# Patient Record
Sex: Female | Born: 1990 | Race: Black or African American | Hispanic: No | Smoking: Never smoker
Health system: Southern US, Community
[De-identification: ages and names within clinical notes are randomized; demographics above are authoritative.]

## PROBLEM LIST (undated history)

## (undated) DIAGNOSIS — J45909 Unspecified asthma, uncomplicated: Secondary | ICD-10-CM

## (undated) HISTORY — PX: NO PAST SURGERIES: SHX2092

---
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
## 2010-09-22 LAB — POCT URINALYSIS DIP (DEVICE)
Bilirubin Urine: NEGATIVE
Glucose, UA: NEGATIVE mg/dL
Protein, ur: 30 mg/dL — AB

## 2010-09-22 LAB — POCT PREGNANCY, URINE: Preg Test, Ur: NEGATIVE

## 2016-09-28 ENCOUNTER — Ambulatory Visit: Payer: 59 | Admitting: Obstetrics and Gynecology

## 2017-09-27 ENCOUNTER — Emergency Department (HOSPITAL_COMMUNITY)
Admission: EM | Admit: 2017-09-27 | Discharge: 2017-09-28 | Disposition: A | Discharge status: Home / Self Care | Payer: Self-pay | Attending: Emergency Medicine | Admitting: Emergency Medicine

## 2017-09-27 ENCOUNTER — Encounter (HOSPITAL_COMMUNITY): Payer: Self-pay

## 2017-09-27 ENCOUNTER — Other Ambulatory Visit: Payer: Self-pay

## 2017-09-27 DIAGNOSIS — N10 Acute tubulo-interstitial nephritis: Secondary | ICD-10-CM | POA: Insufficient documentation

## 2017-09-27 DIAGNOSIS — Z79899 Other long term (current) drug therapy: Secondary | ICD-10-CM | POA: Insufficient documentation

## 2017-09-27 DIAGNOSIS — N12 Tubulo-interstitial nephritis, not specified as acute or chronic: Secondary | ICD-10-CM

## 2017-09-27 DIAGNOSIS — J45909 Unspecified asthma, uncomplicated: Secondary | ICD-10-CM | POA: Insufficient documentation

## 2017-09-27 HISTORY — DX: Unspecified asthma, uncomplicated: J45.909

## 2017-09-27 LAB — URINALYSIS, ROUTINE W REFLEX MICROSCOPIC
Bacteria, UA: NONE SEEN
Bilirubin Urine: NEGATIVE
Glucose, UA: NEGATIVE mg/dL
Ketones, ur: 80 mg/dL — AB
Nitrite: NEGATIVE
PROTEIN: 100 mg/dL — AB
SPECIFIC GRAVITY, URINE: 1.016 (ref 1.005–1.030)
SQUAMOUS EPITHELIAL / LPF: NONE SEEN
pH: 5 (ref 5.0–8.0)

## 2017-09-27 LAB — CBC
HCT: 43.2 % (ref 36.0–46.0)
Hemoglobin: 14.6 g/dL (ref 12.0–15.0)
MCH: 31 pg (ref 26.0–34.0)
MCHC: 33.8 g/dL (ref 30.0–36.0)
MCV: 91.7 fL (ref 78.0–100.0)
PLATELETS: 293 10*3/uL (ref 150–400)
RBC: 4.71 MIL/uL (ref 3.87–5.11)
RDW: 13.4 % (ref 11.5–15.5)
WBC: 13.5 10*3/uL — AB (ref 4.0–10.5)

## 2017-09-27 LAB — COMPREHENSIVE METABOLIC PANEL
ALT: 9 U/L — AB (ref 14–54)
AST: 17 U/L (ref 15–41)
Albumin: 4.5 g/dL (ref 3.5–5.0)
Alkaline Phosphatase: 69 U/L (ref 38–126)
Anion gap: 17 — ABNORMAL HIGH (ref 5–15)
BUN: 14 mg/dL (ref 6–20)
CO2: 15 mmol/L — AB (ref 22–32)
CREATININE: 1.01 mg/dL — AB (ref 0.44–1.00)
Calcium: 9.7 mg/dL (ref 8.9–10.3)
Chloride: 101 mmol/L (ref 101–111)
Glucose, Bld: 85 mg/dL (ref 65–99)
Potassium: 4.1 mmol/L (ref 3.5–5.1)
Sodium: 133 mmol/L — ABNORMAL LOW (ref 135–145)
Total Bilirubin: 0.9 mg/dL (ref 0.3–1.2)
Total Protein: 8.7 g/dL — ABNORMAL HIGH (ref 6.5–8.1)

## 2017-09-27 LAB — LIPASE, BLOOD: Lipase: 23 U/L (ref 11–51)

## 2017-09-27 LAB — I-STAT BETA HCG BLOOD, ED (MC, WL, AP ONLY)

## 2017-09-27 NOTE — ED Triage Notes (Signed)
Pt reports RLQ abd pain x 2-3 days radiating to LLQ. Pt reports she has IUD in place and thinks pain may be due to that. Pt reports she has an IUD x 5 year and was due to take it out in November but has not done so. Pt reports minimal bleeding/spotting

## 2017-09-28 ENCOUNTER — Emergency Department (HOSPITAL_COMMUNITY): Payer: Self-pay

## 2017-09-28 MED ORDER — SODIUM CHLORIDE 0.9 % IV BOLUS
1000.0000 mL | Freq: Once | INTRAVENOUS | Status: AC
  Administered 2017-09-28: 1000 mL via INTRAVENOUS

## 2017-09-28 MED ORDER — IOPAMIDOL (ISOVUE-300) INJECTION 61%
INTRAVENOUS | Status: AC
  Filled 2017-09-28: qty 100

## 2017-09-28 MED ORDER — HYDROCODONE-ACETAMINOPHEN 5-325 MG PO TABS: 1.0000 | ORAL_TABLET | Freq: Four times a day (QID) | ORAL | 0 refills | Status: DC | PRN

## 2017-09-28 MED ORDER — CEPHALEXIN 500 MG PO CAPS: 500.0000 mg | ORAL_CAPSULE | Freq: Three times a day (TID) | ORAL | 0 refills | Status: DC

## 2017-09-28 MED ORDER — IOPAMIDOL (ISOVUE-300) INJECTION 61%
100.0000 mL | Freq: Once | INTRAVENOUS | Status: AC | PRN
  Administered 2017-09-28: 100 mL via INTRAVENOUS

## 2017-09-28 MED ORDER — SODIUM CHLORIDE 0.9 % IV SOLN
1.0000 g | Freq: Once | INTRAVENOUS | Status: AC
  Administered 2017-09-28: 1 g via INTRAVENOUS
  Filled 2017-09-28: qty 10

## 2017-09-28 NOTE — ED Provider Notes (Signed)
[facility] EMERGENCY DEPARTMENT Provider Note   CSN: [id number] Arrival date & time: 09/27/17  1817     History   Chief Complaint Chief Complaint  Patient presents with  . Abdominal Pain    HPI Amanda Rhodes is a 27 y.o. female.  HPI  This is a 27 year old female who presents with 3-day history of right lower quadrant pain.  Patient reports periumbilical and right lower quadrant pain that has been constant over the last 3 days.  She reports at times it is sharp.  Nonradiating.  She denies any back pain.  She denies any fevers but does report chills.  She states she has had decreased p.o. intake.  No vomiting or diarrhea.  She does report dysuria.  No hematuria.  Currently she rates her pain at 8 out of 10.  She has never had symptoms like this before.   Past Medical History:  Diagnosis Date  . Asthma     There are no active problems to display for this patient.   History reviewed. No pertinent surgical history.   OB History   None      Home Medications    Prior to Admission medications   Medication Sig Start Date End Date Taking? Authorizing Provider  diphenhydramine-acetaminophen (TYLENOL PM) 25-500 MG TABS tablet Take 2 tablets by mouth at bedtime as needed (pain).   Yes [provider]  cephALEXin (KEFLEX) 500 MG capsule Take 1 capsule (500 mg total) by mouth 3 (three) times daily. 09/28/17   Bane Hagy, Mayer Masker, MD  HYDROcodone-acetaminophen (NORCO/VICODIN) 5-325 MG tablet Take 1 tablet by mouth every 6 (six) hours as needed. 09/28/17   Tri Chittick, Mayer Masker, MD    Family History History reviewed. No pertinent family history.  Social History Social History   Tobacco Use  . Smoking status: Never Smoker  . Smokeless tobacco: Never Used  Substance Use Topics  . Alcohol use: Yes    Alcohol/week: 6.0 oz    Types: 10 Cans of beer per week  . Drug use: Not Currently     Allergies   Patient has no known allergies.   Review of  Systems Review of Systems  Constitutional: Positive for chills. Negative for fever.  Respiratory: Negative for shortness of breath.   Cardiovascular: Negative for chest pain.  Gastrointestinal: Positive for abdominal pain. Negative for nausea and vomiting.  Genitourinary: Positive for dysuria. Negative for hematuria.  Musculoskeletal: Negative for back pain.  Neurological: Negative for headaches.  All other systems reviewed and are negative.    Physical Exam Updated Vital Signs BP 122/83   Pulse 93   Temp 98.6 F (37 C) (Oral)   Resp 16   Ht 5\' 5"  (1.651 m)   Wt 72.6 kg (160 lb)   SpO2 100%   BMI 26.63 kg/m   Physical Exam  Constitutional: She is oriented to person, place, and time. She appears well-developed and well-nourished. She does not appear ill.  HENT:  Head: Normocephalic and atraumatic.  Neck: Neck supple.  Cardiovascular: Normal rate, regular rhythm and normal heart sounds.  Pulmonary/Chest: Effort normal. No respiratory distress. She has no wheezes.  Abdominal: Soft. Normal appearance and bowel sounds are normal. There is tenderness in the right lower quadrant. There is CVA tenderness.  Right lower quadrant tenderness to palpation, no rebound or guarding, negative Rovsing's  Neurological: She is alert and oriented to person, place, and time.  Skin: Skin is warm and dry.  Psychiatric: She has a normal mood  and affect.  Nursing note and vitals reviewed.    ED Treatments / Results  Labs (all labs ordered are listed, but only abnormal results are displayed) Labs Reviewed  COMPREHENSIVE METABOLIC PANEL - Abnormal; Notable for the following components:      Result Value   Sodium 133 (*)    CO2 15 (*)    Creatinine, Ser 1.01 (*)    Total Protein 8.7 (*)    ALT 9 (*)    Anion gap 17 (*)    All other components within normal limits  CBC - Abnormal; Notable for the following components:   WBC 13.5 (*)    All other components within normal limits    URINALYSIS, ROUTINE W REFLEX MICROSCOPIC - Abnormal; Notable for the following components:   APPearance CLOUDY (*)    Hgb urine dipstick LARGE (*)    Ketones, ur 80 (*)    Protein, ur 100 (*)    Leukocytes, UA LARGE (*)    Non Squamous Epithelial 0-5 (*)    All other components within normal limits  URINE CULTURE  LIPASE, BLOOD  I-STAT BETA HCG BLOOD, ED ([facility], [facility], [facility] ONLY)    EKG None  Radiology Ct Abdomen Pelvis W Contrast  Result Date: 09/28/2017 CLINICAL DATA:  Acute abdominal pain.  Right-sided pain. EXAM: CT ABDOMEN AND PELVIS WITH CONTRAST TECHNIQUE: Multidetector CT imaging of the abdomen and pelvis was performed using the standard protocol following bolus administration of intravenous contrast. CONTRAST:  ISOVUE-300 IOPAMIDOL (ISOVUE-300) INJECTION 61% COMPARISON:  None. FINDINGS: Lower chest: The lung bases are clear. Hepatobiliary: No focal liver abnormality is seen. No gallstones, gallbladder wall thickening, or biliary dilatation. Pancreas: No ductal dilatation or inflammation. Spleen: Normal in size without focal abnormality. Adrenals/Urinary Tract: Normal adrenal glands. Mild right hydronephrosis with perinephric edema. Heterogeneous right renal enhancement. Peri tear extending about the proximal ureter. No urolithiasis or cause of obstruction. Urinary bladder is nondistended limiting assessment, probable bladder wall thickening and perivesicular stranding. No perirenal or intrarenal abscess or fluid collection. Homogeneous left renal enhancement without left hydronephrosis. Stomach/Bowel: Stomach is within normal limits. Appendix is tentatively identified and appears normal. Regardless, no pericecal or right lower quadrant inflammation. No evidence of bowel wall thickening, distention, or inflammatory changes. Vascular/Lymphatic: No significant vascular findings are present. No enlarged abdominal or pelvic lymph nodes. Reproductive: Retroverted uterus with IUD in place.  Peripherally enhancing corpus luteal cyst in the left ovary with adjacent prominent follicle. Right ovary is normal in size. Minimal free fluid in the pelvis likely physiologic. Other: No upper abdominal ascites.  No free air. Musculoskeletal: There are no acute or suspicious osseous abnormalities. IMPRESSION: Findings consistent with right pyelonephritis and probable cystitis. No renal abscess. Electronically Signed   By: Rubye Oaks M.D.   On: 09/28/2017 04:27    Procedures Procedures (including critical care time)  Medications Ordered in ED Medications  iopamidol (ISOVUE-300) 61 % injection (has no administration in time range)  sodium chloride 0.9 % bolus 1,000 mL (1,000 mLs Intravenous New Bag/Given 09/28/17 0352)  cefTRIAXone (ROCEPHIN) 1 g in sodium chloride 0.9 % 100 mL IVPB (1 g Intravenous New Bag/Given 09/28/17 0352)  iopamidol (ISOVUE-300) 61 % injection 100 mL (100 mLs Intravenous Contrast Given 09/28/17 0405)     Initial Impression / Assessment and Plan / ED Course  I have reviewed the triage vital signs and the nursing notes.  Pertinent labs & imaging results that were available during my care of the patient were reviewed by  me and considered in my medical decision making (see chart for details).     Patient presents with 3-day history of right lower quadrant pain.  She is overall nontoxic appearing.  Vital signs notable for tachycardia.  Temperature 99.6.  She reports dysuria.  Urinalysis does have too numerous to count red cells and white cells.  Urine culture was sent.  Patient has a leukocytosis.  Feel this is likely pyelonephritis; however, cannot rule out appendicitis which would also have a similar presentation and can have pyuria.  Given that most of her pain is in the right lower quadrant and not her back, will obtain a CT scan to evaluate for appendicitis.  This will also evaluate for possible kidney stones.  CT scan shows only evidence of pyelonephritis.  Patient was  given IV Rocephin.  She is otherwise nontoxic appearing.  Vital signs improved with fluids.  She feels much better.  Will discharge home with antibiotics and pain medication.  After history, exam, and medical workup I feel the patient has been appropriately medically screened and is safe for discharge home. Pertinent diagnoses were discussed with the patient. Patient was given return precautions.   Final Clinical Impressions(s) / ED Diagnoses   Final diagnoses:  Pyelonephritis    ED Discharge Orders        Ordered    cephALEXin (KEFLEX) 500 MG capsule  3 times daily     09/28/17 0454    HYDROcodone-acetaminophen (NORCO/VICODIN) 5-325 MG tablet  Every 6 hours PRN     09/28/17 0454       Shon BatonHorton, Cozetta Seif F, MD 09/28/17 [phone]

## 2017-09-28 NOTE — Discharge Instructions (Addendum)
You were seen today for right-sided abdominal pain.  You have a kidney infection.  Take antibiotics as prescribed.  If you develop any new or worsening symptoms or fevers you need to be reevaluated.

## 2017-09-30 LAB — URINE CULTURE: Culture: >=100000 — AB

## 2017-10-01 ENCOUNTER — Telehealth: Payer: Self-pay

## 2017-10-01 NOTE — Telephone Encounter (Signed)
Post ED Visit - Positive Culture Follow-up  Culture report reviewed by antimicrobial stewardship pharmacist:  []  Enzo BiNathan Batchelder, Pharm.D. []  Celedonio MiyamotoJeremy Frens, [address] BCPS AQ-ID []  Garvin FilaMike Maccia, Pharm.D., BCPS []  Georgina PillionElizabeth Martin, [address] BCPS []  VincoMinh Pham, [address]Ph[address]., BCPS, AAHIVP []  Estella HuskMichelle Turner, Pharm.D., BCPS, AAHIVP []  Lysle Pearlachel Rumbarger, PharmD, BCPS []  Blake DivineShannon Parkey, PharmD []  Pollyann SamplesAndy Johnston, PharmD, BCPS Sharin MonsEmily Sinclair Pharm D Positive Urine culture Treated with Cephalexin, organism sensitive to the same and no further patient follow-up is required at this time.  Jerry CarasCullom, Shavon Ashmore Burnett 10/01/2017, 10:37 AM

## 2018-01-10 ENCOUNTER — Encounter (HOSPITAL_COMMUNITY): Payer: Self-pay | Admitting: Emergency Medicine

## 2018-01-10 ENCOUNTER — Ambulatory Visit (HOSPITAL_COMMUNITY)
Admission: EM | Admit: 2018-01-10 | Discharge: 2018-01-10 | Disposition: A | Discharge status: Home / Self Care | Payer: Self-pay | Attending: Family Medicine | Admitting: Family Medicine

## 2018-01-10 DIAGNOSIS — J45909 Unspecified asthma, uncomplicated: Secondary | ICD-10-CM | POA: Insufficient documentation

## 2018-01-10 DIAGNOSIS — R300 Dysuria: Secondary | ICD-10-CM | POA: Insufficient documentation

## 2018-01-10 DIAGNOSIS — R1032 Left lower quadrant pain: Secondary | ICD-10-CM | POA: Insufficient documentation

## 2018-01-10 DIAGNOSIS — Z3202 Encounter for pregnancy test, result negative: Secondary | ICD-10-CM

## 2018-01-10 DIAGNOSIS — R509 Fever, unspecified: Secondary | ICD-10-CM | POA: Insufficient documentation

## 2018-01-10 DIAGNOSIS — N12 Tubulo-interstitial nephritis, not specified as acute or chronic: Secondary | ICD-10-CM | POA: Insufficient documentation

## 2018-01-10 DIAGNOSIS — R000 Tachycardia, unspecified: Secondary | ICD-10-CM | POA: Insufficient documentation

## 2018-01-10 DIAGNOSIS — R112 Nausea with vomiting, unspecified: Secondary | ICD-10-CM | POA: Insufficient documentation

## 2018-01-10 DIAGNOSIS — R319 Hematuria, unspecified: Secondary | ICD-10-CM | POA: Insufficient documentation

## 2018-01-10 LAB — POCT URINALYSIS DIP (DEVICE)
GLUCOSE, UA: NEGATIVE mg/dL
KETONES UR: 40 mg/dL — AB
Nitrite: NEGATIVE
Protein, ur: 100 mg/dL — AB
SPECIFIC GRAVITY, URINE: 1.02 (ref 1.005–1.030)
UROBILINOGEN UA: 0.2 mg/dL (ref 0.0–1.0)
pH: 5.5 (ref 5.0–8.0)

## 2018-01-10 LAB — POCT PREGNANCY, URINE: Preg Test, Ur: NEGATIVE

## 2018-01-10 MED ORDER — CEFTRIAXONE SODIUM 1 G IJ SOLR
INTRAMUSCULAR | Status: AC
  Filled 2018-01-10: qty 10

## 2018-01-10 MED ORDER — ONDANSETRON HCL 4 MG PO TABS: 4.0000 mg | ORAL_TABLET | Freq: Three times a day (TID) | ORAL | 0 refills | Status: DC | PRN

## 2018-01-10 MED ORDER — ONDANSETRON 4 MG PO TBDP
4.0000 mg | ORAL_TABLET | Freq: Once | ORAL | Status: AC
  Administered 2018-01-10: 4 mg via ORAL

## 2018-01-10 MED ORDER — IBUPROFEN 800 MG PO TABS: 800.0000 mg | ORAL_TABLET | Freq: Three times a day (TID) | ORAL | 0 refills | Status: DC

## 2018-01-10 MED ORDER — CEFTRIAXONE SODIUM 1 G IJ SOLR
1.0000 g | Freq: Once | INTRAMUSCULAR | Status: AC
  Administered 2018-01-10: 1 g via INTRAMUSCULAR

## 2018-01-10 MED ORDER — LIDOCAINE HCL (PF) 2 % IJ SOLN
INTRAMUSCULAR | Status: AC
  Filled 2018-01-10: qty 2

## 2018-01-10 MED ORDER — ONDANSETRON 4 MG PO TBDP
ORAL_TABLET | ORAL | Status: AC
  Filled 2018-01-10: qty 1

## 2018-01-10 MED ORDER — CEPHALEXIN 500 MG PO CAPS: 500.0000 mg | ORAL_CAPSULE | Freq: Three times a day (TID) | ORAL | 0 refills | Status: DC

## 2018-01-10 NOTE — ED Triage Notes (Signed)
Pt here for UTI sx and not feeling better after meds

## 2018-01-10 NOTE — ED Notes (Signed)
Patient given PO fluid challenge per provider.

## 2018-01-10 NOTE — Discharge Instructions (Signed)
It was nice meeting you!!  I am treating you for a kidney infection. It is really important that you stay hydrated.  You were able to tolerate the Gatorade here so I think its safe to go home.  I am giving you some more nausea medication and antibiotics. Please finish the full course of antibiotics.  As we discussed if you develop worsening symptom, more severe pain, fever and unable to hold down fluids please go to the ER.

## 2018-01-10 NOTE — ED Provider Notes (Signed)
[facility]    CSN: [id number] Arrival date & time: 01/10/18  1307     History   Chief Complaint Chief Complaint  Patient presents with  . Urinary Tract Infection    appt 1300    HPI [name] is a 27 y.o. female.   Patient is a healthy 27 year old female that comes in with 1 week of symptoms.  She has been having left flank pain, left lower abdominal pain, nausea, vomiting, fever, chills.  She has been unable to really eat or drink anything.  She has been having some dysuria, hematuria.  Taking over-the-counter urinary symptom relief.  She has not taken any fever reducer.  She was treated for pyelonephritis back in April.  She did improve after treatment with that.  Reports this instance is worse.  She denies any constipation, diarrhea, vaginal bleeding or discharge.  She denies any injuries to the back or heavy lifting.   She also admits to having her IUD in for the past 6-1/2 years and she is unsure of her last menstrual period.   ROS per HPI      Past Medical History:  Diagnosis Date  . Asthma     There are no active problems to display for this patient.   History reviewed. No pertinent surgical history.  OB History   None      Home Medications    Prior to Admission medications   Medication Sig Start Date End Date Taking? Authorizing Provider  cephALEXin (KEFLEX) 500 MG capsule Take 1 capsule (500 mg total) by mouth 3 (three) times daily. 09/28/17   Horton, Mayer Maskerourtney F, MD  diphenhydramine-acetaminophen (TYLENOL PM) 25-500 MG TABS tablet Take 2 tablets by mouth at bedtime as needed (pain).    [provider]  HYDROcodone-acetaminophen (NORCO/VICODIN) 5-325 MG tablet Take 1 tablet by mouth every 6 (six) hours as needed. Patient not taking: Reported on 01/10/2018 09/28/17   Horton, Mayer Maskerourtney F, MD    Family History History reviewed. No pertinent family history.  Social History Social History   Tobacco Use  . Smoking status: Never  Smoker  . Smokeless tobacco: Never Used  Substance Use Topics  . Alcohol use: Yes    Alcohol/week: 6.0 oz    Types: 10 Cans of beer per week  . Drug use: Not Currently     Allergies   Patient has no known allergies.   Review of Systems Review of Systems   Physical Exam Triage Vital Signs ED Triage Vitals [01/10/18 1328]  Enc Vitals Group     BP 110/82     Pulse Rate (!) 124     Resp 18     Temp 98.4 F (36.9 C)     Temp Source Oral     SpO2 100 %     Weight      Height      Head Circumference      Peak Flow      Pain Score      Pain Loc      Pain Edu?      Excl. in GC?    No data found.  Updated Vital Signs BP 110/82 (BP Location: Left Arm)   Pulse (!) 124   Temp 98.4 F (36.9 C) (Oral)   Resp 18   SpO2 100%   Visual Acuity Right Eye Distance:   Left Eye Distance:   Bilateral Distance:    Right Eye Near:   Left Eye Near:    Bilateral  Near:     Physical Exam  Constitutional: She is oriented to person, place, and time. She appears well-developed and well-nourished.  HENT:  Head: Normocephalic and atraumatic.  Cardiovascular: Regular rhythm.  Pulmonary/Chest: Effort normal and breath sounds normal.  Abdominal:  Positive left CVA tenderness.  Tender to palpation of left flank and left lower quadrant.   Lymphadenopathy:    She has no cervical adenopathy.  Neurological: She is alert and oriented to person, place, and time.  Skin: Skin is warm and dry. Capillary refill takes 2 to 3 seconds.  Psychiatric: She has a normal mood and affect.  Nursing note and vitals reviewed.    UC Treatments / Results  Labs (all labs ordered are listed, but only abnormal results are displayed) Labs Reviewed  POCT URINALYSIS DIP (DEVICE) - Abnormal; Notable for the following components:      Result Value   Bilirubin Urine SMALL (*)    Ketones, ur 40 (*)    Hgb urine dipstick MODERATE (*)    Protein, ur 100 (*)    Leukocytes, UA SMALL (*)    All other  components within normal limits  URINE CULTURE  POCT PREGNANCY, URINE    EKG None  Radiology No results found.  Procedures Procedures (including critical care time)  Medications Ordered in UC Medications  cefTRIAXone (ROCEPHIN) injection 1 g (has no administration in time range)  ondansetron (ZOFRAN-ODT) disintegrating tablet 4 mg (has no administration in time range)    Initial Impression / Assessment and Plan / UC Course  I have reviewed the triage vital signs and the nursing notes.  Pertinent labs & imaging results that were available during my care of the patient were reviewed by me and considered in my medical decision making (see chart for details).     Based on patient urine results, sickle exam and tachycardia will treat for pyelonephritis with 1 g Rocephin in clinic.  Orthostatics slightly positive will give Zofran for nausea and try to rehydrate orally.  If patient unable to tolerate oral fluids will place IV for rehydration.    Patient feeling slightly better and able to tolerate p.o. Liquids.  Will discharge home with prescription for Keflex and Zofran for nausea vomiting.   Patient made aware that if she develops more severe pain, vomiting, fever and is unable to hold any liquids she needs to go to the ER.  Patient agreeable to plan Final Clinical Impressions(s) / UC Diagnoses   Final diagnoses:  None   Discharge Instructions   None    ED Prescriptions    None     Controlled Substance Prescriptions [place] Controlled Substance Registry consulted? Not Applicable   Janace Aris, NP 01/10/18 [phone]

## 2018-01-12 LAB — URINE CULTURE

## 2019-07-24 ENCOUNTER — Ambulatory Visit (INDEPENDENT_AMBULATORY_CARE_PROVIDER_SITE_OTHER): Payer: PRIVATE HEALTH INSURANCE | Admitting: *Deleted

## 2019-07-24 ENCOUNTER — Other Ambulatory Visit: Payer: Self-pay

## 2019-07-24 ENCOUNTER — Encounter: Payer: Self-pay | Admitting: General Practice

## 2019-07-24 VITALS — BP 117/78 | HR 100 | Temp 98.0°F | Ht 65.0 in | Wt 191.0 lb

## 2019-07-24 DIAGNOSIS — Z3200 Encounter for pregnancy test, result unknown: Secondary | ICD-10-CM

## 2019-07-24 DIAGNOSIS — Z3201 Encounter for pregnancy test, result positive: Secondary | ICD-10-CM

## 2019-07-24 LAB — POCT URINE PREGNANCY: Preg Test, Ur: POSITIVE — AB

## 2019-07-24 NOTE — Progress Notes (Signed)
   Ms. Fesperman presents today for UPT. She has no unusual complaints. LMP: 06/13/2019    OBJECTIVE: Appears well, in no apparent distress.  OB History    Gravida  1   Para      Term      Preterm      AB      Living        SAB      TAB      Ectopic      Multiple      Live Births             Home UPT Result: Positive In-Office UPT result: Positive I have reviewed the patient's medical, obstetrical, social, and family histories, and medications.   ASSESSMENT: Positive pregnancy test  EDD: 03/11/2020 by LMP GA: [redacted]w[redacted]d  PLAN Prenatal care to be completed at: [facility]; Patient live closer to the office. Number given to call for an appointment.  Clovis Pu, RN

## 2019-07-27 ENCOUNTER — Other Ambulatory Visit: Payer: Self-pay

## 2019-07-27 ENCOUNTER — Inpatient Hospital Stay (HOSPITAL_COMMUNITY)
Admission: AD | Admit: 2019-07-27 | Discharge: 2019-07-27 | Disposition: A | Discharge status: Home / Self Care | Payer: PRIVATE HEALTH INSURANCE | Attending: Obstetrics & Gynecology | Admitting: Obstetrics & Gynecology

## 2019-07-27 ENCOUNTER — Inpatient Hospital Stay (HOSPITAL_COMMUNITY): Payer: PRIVATE HEALTH INSURANCE

## 2019-07-27 ENCOUNTER — Encounter (HOSPITAL_COMMUNITY): Payer: Self-pay | Admitting: Diagnostic Radiology

## 2019-07-27 DIAGNOSIS — O26891 Other specified pregnancy related conditions, first trimester: Secondary | ICD-10-CM | POA: Diagnosis not present

## 2019-07-27 DIAGNOSIS — O3680X0 Pregnancy with inconclusive fetal viability, not applicable or unspecified: Secondary | ICD-10-CM | POA: Diagnosis not present

## 2019-07-27 DIAGNOSIS — O209 Hemorrhage in early pregnancy, unspecified: Secondary | ICD-10-CM

## 2019-07-27 DIAGNOSIS — Z3A01 Less than 8 weeks gestation of pregnancy: Secondary | ICD-10-CM | POA: Diagnosis not present

## 2019-07-27 DIAGNOSIS — R102 Pelvic and perineal pain: Secondary | ICD-10-CM | POA: Diagnosis present

## 2019-07-27 LAB — CBC
HCT: 39.8 % (ref 36.0–46.0)
Hemoglobin: 13.3 g/dL (ref 12.0–15.0)
MCH: 30.8 pg (ref 26.0–34.0)
MCHC: 33.4 g/dL (ref 30.0–36.0)
MCV: 92.1 fL (ref 80.0–100.0)
Platelets: 366 10*3/uL (ref 150–400)
RBC: 4.32 MIL/uL (ref 3.87–5.11)
RDW: 11.7 % (ref 11.5–15.5)
WBC: 10.7 10*3/uL — ABNORMAL HIGH (ref 4.0–10.5)
nRBC: 0 % (ref 0.0–0.2)

## 2019-07-27 LAB — URINALYSIS, ROUTINE W REFLEX MICROSCOPIC
Bilirubin Urine: NEGATIVE
Glucose, UA: NEGATIVE mg/dL
Ketones, ur: 80 mg/dL — AB
Leukocytes,Ua: NEGATIVE
Nitrite: NEGATIVE
Protein, ur: 30 mg/dL — AB
Specific Gravity, Urine: 1.026 (ref 1.005–1.030)
pH: 5 (ref 5.0–8.0)

## 2019-07-27 LAB — WET PREP, GENITAL
Clue Cells Wet Prep HPF POC: NONE SEEN
Sperm: NONE SEEN
Trich, Wet Prep: NONE SEEN
Yeast Wet Prep HPF POC: NONE SEEN

## 2019-07-27 LAB — HCG, QUANTITATIVE, PREGNANCY: hCG, Beta Chain, Quant, S: 224 m[IU]/mL — ABNORMAL HIGH (ref <5)

## 2019-07-27 LAB — HIV ANTIBODY (ROUTINE TESTING W REFLEX): HIV Screen 4th Generation wRfx: NONREACTIVE

## 2019-07-27 NOTE — MAU Provider Note (Signed)
History     CSN: [id number]  Arrival date and time: 07/27/19 1644   First Provider Initiated Contact with Patient 07/27/19 1812      Chief Complaint  Patient presents with  . Pelvic Pain  . Vaginal Bleeding   Amanda Rhodes is 29 y.o. G1P0 at [redacted]w[redacted]d who presents today with cramping and bleeding. NOB intake on 08/13/19 at [place].   Pelvic Pain The patient's primary symptoms include pelvic pain and vaginal bleeding. The patient's pertinent negatives include no vaginal discharge. This is a new problem. The current episode started in the past 7 days. The problem occurs constantly. The problem has been gradually worsening. The problem affects both sides. She is pregnant. Associated symptoms include nausea. Pertinent negatives include no chills, dysuria, fever or vomiting. The vaginal discharge was bloody. The vaginal bleeding is lighter than menses. Nothing aggravates the symptoms. She has tried nothing for the symptoms. She uses nothing for contraception. Her menstrual history has been regular (LMP 06/13/2019).  Vaginal Bleeding The patient's primary symptoms include pelvic pain and vaginal bleeding. The patient's pertinent negatives include no vaginal discharge. Associated symptoms include nausea. Pertinent negatives include no chills, dysuria, fever or vomiting.    OB History    Gravida  1   Para      Term      Preterm      AB      Living        SAB      TAB      Ectopic      Multiple      Live Births              Past Medical History:  Diagnosis Date  . Asthma     Past Surgical History:  Procedure Laterality Date  . NO PAST SURGERIES      Family History  Problem Relation Age of Onset  . Healthy Mother   . Healthy Father     Social History   Tobacco Use  . Smoking status: Never Smoker  . Smokeless tobacco: Never Used  Substance Use Topics  . Alcohol use: Yes    Alcohol/week: 10.0 standard drinks    Types: 10 Cans of beer per week   Comment: Socially  . Drug use: Not Currently    Types: Marijuana    Comment: Last smoked early January 2021    Allergies: No Known Allergies  Medications Prior to Admission  Medication Sig Dispense Refill Last Dose  . Prenatal Vit-Fe Fumarate-FA (MULTIVITAMIN-PRENATAL) 27-0.8 MG TABS tablet Take 1 tablet by mouth daily at 12 noon.   07/26/2019 at 1000  . cephALEXin (KEFLEX) 500 MG capsule Take 1 capsule (500 mg total) by mouth 3 (three) times daily. 21 capsule 0   . diphenhydramine-acetaminophen (TYLENOL PM) 25-500 MG TABS tablet Take 2 tablets by mouth at bedtime as needed (pain).     Marland Kitchen HYDROcodone-acetaminophen (NORCO/VICODIN) 5-325 MG tablet Take 1 tablet by mouth every 6 (six) hours as needed. (Patient not taking: Reported on 01/10/2018) 10 tablet 0   . ibuprofen (ADVIL,MOTRIN) 800 MG tablet Take 1 tablet (800 mg total) by mouth 3 (three) times daily. 21 tablet 0   . ondansetron (ZOFRAN) 4 MG tablet Take 1 tablet (4 mg total) by mouth every 8 (eight) hours as needed for nausea or vomiting. 4 tablet 0     Review of Systems  Constitutional: Negative for chills and fever.  Gastrointestinal: Positive for nausea. Negative for vomiting.  Genitourinary: Positive for pelvic  pain and vaginal bleeding. Negative for dysuria and vaginal discharge.   Physical Exam   Blood pressure 121/74, pulse 99, temperature 99 F (37.2 C), temperature source Oral, resp. rate 17, height 5\' 5"  (1.651 m), weight 85.1 kg, last menstrual period 06/13/2019, SpO2 100 %.  Physical Exam  Nursing note and vitals reviewed. Constitutional: She is oriented to person, place, and time. She appears well-developed and well-nourished. No distress.  HENT:  Head: Normocephalic.  Cardiovascular: Normal rate.  Respiratory: Effort normal.  GI: Soft. There is no abdominal tenderness. There is no rebound.  Neurological: She is alert and oriented to person, place, and time.  Skin: Skin is warm and dry.  Psychiatric: She has a  normal mood and affect.    Results for orders placed or performed during the hospital encounter of 07/27/19 (from the past 24 hour(s))  Urinalysis, Routine w reflex microscopic     Status: Abnormal   Collection Time: 07/27/19  5:44 PM  Result Value Ref Range   Color, Urine YELLOW YELLOW   APPearance CLEAR CLEAR   Specific Gravity, Urine 1.026 1.005 - 1.030   pH 5.0 5.0 - 8.0   Glucose, UA NEGATIVE NEGATIVE mg/dL   Hgb urine dipstick MODERATE (A) NEGATIVE   Bilirubin Urine NEGATIVE NEGATIVE   Ketones, ur 80 (A) NEGATIVE mg/dL   Protein, ur 30 (A) NEGATIVE mg/dL   Nitrite NEGATIVE NEGATIVE   Leukocytes,Ua NEGATIVE NEGATIVE   RBC / HPF 21-50 0 - 5 RBC/hpf   WBC, UA 0-5 0 - 5 WBC/hpf   Bacteria, UA RARE (A) NONE SEEN   Squamous Epithelial / LPF 0-5 0 - 5   Mucus PRESENT   CBC     Status: Abnormal   Collection Time: 07/27/19  6:15 PM  Result Value Ref Range   WBC 10.7 (H) 4.0 - 10.5 K/uL   RBC 4.32 3.87 - 5.11 MIL/uL   Hemoglobin 13.3 12.0 - 15.0 g/dL   HCT 09/24/19 89.3 - 73.4 %   MCV 92.1 80.0 - 100.0 fL   MCH 30.8 26.0 - 34.0 pg   MCHC 33.4 30.0 - 36.0 g/dL   RDW 28.7 68.1 - 15.7 %   Platelets 366 150 - 400 K/uL   nRBC 0.0 0.0 - 0.2 %  hCG, quantitative, pregnancy     Status: Abnormal   Collection Time: 07/27/19  6:15 PM  Result Value Ref Range   hCG, Beta Chain, Quant, S 224 (H) <5 mIU/mL  ABO/Rh     Status: None   Collection Time: 07/27/19  6:15 PM  Result Value Ref Range   ABO/RH(D)      O POS Performed at [facility], [address] [place], [place]   Wet prep, genital     Status: Abnormal   Collection Time: 07/27/19  6:34 PM   Specimen: Vaginal  Result Value Ref Range   Yeast Wet Prep HPF POC NONE SEEN NONE SEEN   Trich, Wet Prep NONE SEEN NONE SEEN   Clue Cells Wet Prep HPF POC NONE SEEN NONE SEEN   WBC, Wet Prep HPF POC MANY (A) NONE SEEN   Sperm NONE SEEN    09/24/19 OB LESS THAN 14 WEEKS WITH OB TRANSVAGINAL  Result Date: 07/27/2019 CLINICAL  DATA:  29 year old pregnant female with bleeding. LMP: 06/13/2019 corresponding to an estimated gestational age of [redacted] weeks, 2 days. EXAM: OBSTETRIC <14 WK 06/15/2019 AND TRANSVAGINAL OB US TECHNIQUE: Both transabdominal and transvaginal ultrasound examinations were performed for  complete evaluation of the gestation as well as the maternal uterus, adnexal regions, and pelvic cul-de-sac. Transvaginal technique was performed to assess early pregnancy. COMPARISON:  None. FINDINGS: The uterus is retroflexed. The uterus is slightly heterogeneous. No focal mass. The endometrium is thickened and slightly heterogeneous measuring approximately 12 mm in thickness. A 3 mm cystic structure within the endometrium may represent an endometrial cyst. A blighted ovum or an early gestational sac are less likely but not excluded. No yolk sac or fetal pole identified. The maternal ovaries are unremarkable. There is a corpus luteum in the right ovary. There is a small amount of free fluid in the pelvis. IMPRESSION: 1. No definite intrauterine pregnancy identified and no adnexal masses noted. Findings consistent with pregnancy of unknown location and differential diagnosis includes: Early intrauterine pregnancy, recent spontaneous abortion, or an occult ectopic pregnancy. Clinical correlation and follow-up with serial HCG levels and repeat ultrasound, as clinically indicated, recommended. 2. Thickened and slightly heterogeneous endometrium may contain small amount of blood products/clot. Electronically Signed   By: Elgie Collard M.D.   On: 07/27/2019 19:12     MAU Course  Procedures  MDM   Assessment and Plan   1. Pregnancy, location unknown   2. Pelvic pain in pregnancy, antepartum, first trimester   3. Vaginal bleeding in pregnancy, first trimester   Blood type O+  DC home Comfort measures reviewed  1st Trimester precautions  Bleeding precautions Ectopic precautions RX: none  Return to MAU as needed FU with OB as  planned  Follow-up Information    [facility] Follow up.   Specialty: Obstetrics and Gynecology Why: Monday 07/30/19 at 1:30 for repeat blood work  Solicitor information: [address], [address] [id number] mc [place] [address] [phone]         Thressa Sheller DNP, CNM  07/27/19  7:20 PM

## 2019-07-27 NOTE — Discharge Instructions (Signed)

## 2019-07-27 NOTE — Progress Notes (Signed)
GC/Chlamydia & wet prep cultures obtained by this RN. 

## 2019-07-27 NOTE — MAU Note (Signed)
Is 6 wks preg, has been bleeding, started spotting on Tues, picked up last night, seeing little clots.  Some cramping in lower abd.

## 2019-07-28 LAB — ABO/RH: ABO/RH(D): O POS

## 2019-07-30 ENCOUNTER — Encounter: Payer: Self-pay | Admitting: *Deleted

## 2019-07-30 ENCOUNTER — Ambulatory Visit (INDEPENDENT_AMBULATORY_CARE_PROVIDER_SITE_OTHER): Payer: PRIVATE HEALTH INSURANCE

## 2019-07-30 ENCOUNTER — Other Ambulatory Visit: Payer: Self-pay

## 2019-07-30 DIAGNOSIS — O3680X0 Pregnancy with inconclusive fetal viability, not applicable or unspecified: Secondary | ICD-10-CM

## 2019-07-30 LAB — GC/CHLAMYDIA PROBE AMP (~~LOC~~) NOT AT ARMC
Chlamydia: NEGATIVE
Comment: NEGATIVE
Comment: NORMAL
Neisseria Gonorrhea: NEGATIVE

## 2019-07-30 LAB — BETA HCG QUANT (REF LAB): hCG Quant: 42 m[IU]/mL

## 2019-07-30 NOTE — Progress Notes (Signed)
Pt here today for stat beta HCG following visit to MAU on 07/27/19 for pregnancy of unknown location. Pt states she has been bleeding like a period since that time, but bleeding has begun to taper off like the end of a period. Denies any abdominal or pelvic pain today. Lab drawn. Ectopic precautions reviewed with pt. Explained I will call this afternoon with results after reviewing with a provider.  Beta HCG today is 42, which has decreased from 224 on 07/27/19. Results reviewed with Sparacino, DO who states this is a miscarriage and patient should return on Thursday or Friday for a non stat beta HCG.   Called pt. Results and provider recommendation given. Pt states she will be able to come into the office on Thursday. Does not have any questions at this time. Encouraged pt to follow up with any questions she may have. Front office notified to schedule lab appt.   Fleet Contras RN 07/30/19

## 2019-07-30 NOTE — Progress Notes (Signed)
Chart reviewed for nurse visit. Agree with plan of care.   Diyana Starrett L, DO 07/30/2019 7:32 PM

## 2019-07-31 ENCOUNTER — Other Ambulatory Visit: Payer: Self-pay | Admitting: *Deleted

## 2019-07-31 DIAGNOSIS — O039 Complete or unspecified spontaneous abortion without complication: Secondary | ICD-10-CM

## 2019-08-02 ENCOUNTER — Other Ambulatory Visit: Payer: Self-pay

## 2019-08-02 ENCOUNTER — Other Ambulatory Visit: Payer: PRIVATE HEALTH INSURANCE

## 2019-08-02 DIAGNOSIS — O039 Complete or unspecified spontaneous abortion without complication: Secondary | ICD-10-CM

## 2019-08-03 LAB — BETA HCG QUANT (REF LAB): hCG Quant: 13 m[IU]/mL

## 2019-08-07 ENCOUNTER — Telehealth: Payer: Self-pay

## 2019-08-07 ENCOUNTER — Telehealth: Payer: Self-pay | Admitting: Clinical

## 2019-08-07 NOTE — Telephone Encounter (Signed)
Pt had appt today with [facility]; requested recent lab results. Called pt; VM left requesting pt return call to the office so that we can discuss results and follow up.

## 2019-08-07 NOTE — Telephone Encounter (Signed)
Follow up mood check post-miscarriage: pt would like information about grief resources, and requests lab results.

## 2019-08-08 NOTE — Telephone Encounter (Signed)
Called pt; left VM stating I am calling to go over results and patient may call the office back when she is available. Call back number given.

## 2020-05-19 ENCOUNTER — Ambulatory Visit: Payer: PRIVATE HEALTH INSURANCE

## 2020-06-21 NOTE — L&D Delivery Note (Signed)
OB/GYN Faculty Practice Delivery Note  Amanda Rhodes is a 30 y.o. G2P0010 s/p PPD#0 SVD at [redacted]w[redacted]d. She was admitted for latent labor.   ROM: 2h 65m with clear fluid GBS Status: negative Maximum Maternal Temperature: 97.8  Labor Progress: Pt came in latent labor, did not need any augmentation. Progressed appropriately.   Delivery Date/Time: 0008 Delivery: Called to room and patient was complete and pushing. Head delivered LOA. Nuchal cord present, delivered through. Shoulder and body delivered in usual fashion. Infant with spontaneous cry, placed on mother's abdomen, dried and stimulated. Cord clamped x 2 after 1-minute delay, and cut by FOB under my direct supervision. Cord blood drawn. Placenta delivered spontaneously with gentle cord traction. Fundus firm with massage and Pitocin. Labia, perineum, vagina, and cervix were inspected, 2nd degree perineal tear.   Placenta: complete, three vessel cord appreciated Complications: None  Lacerations: 2nd degree perineal EBL: 300 mL Analgesia: Epidural  Postpartum Planning [x]  message to sent to schedule follow-up   Infant:  APGARs 9, 9    , MD [facility]

## 2020-06-24 ENCOUNTER — Telehealth: Payer: Self-pay

## 2020-06-24 ENCOUNTER — Ambulatory Visit: Payer: PRIVATE HEALTH INSURANCE

## 2020-06-24 NOTE — Telephone Encounter (Signed)
Called patient to reschedule appt, No answer and left an Vm and also sent a MyChart Message as well to reschedule appt.  Sherol Dade

## 2020-06-24 NOTE — Telephone Encounter (Signed)
Called patient to reschedule an appt today. Patient called back and we rescheduled

## 2020-06-24 NOTE — Telephone Encounter (Signed)
Called patient again to reschedule appt today. No answer and left an detailed message. As well as Messaged patient through MyChart as well to reschedule appt.  Amanda Rhodes

## 2020-06-30 ENCOUNTER — Ambulatory Visit (INDEPENDENT_AMBULATORY_CARE_PROVIDER_SITE_OTHER): Payer: PRIVATE HEALTH INSURANCE | Admitting: *Deleted

## 2020-06-30 ENCOUNTER — Other Ambulatory Visit: Payer: Self-pay

## 2020-06-30 DIAGNOSIS — Z3200 Encounter for pregnancy test, result unknown: Secondary | ICD-10-CM

## 2020-06-30 NOTE — Progress Notes (Signed)
   Patient in clinic for pregnancy test. Unable to void. Appointment rescheduled for tomorrow at 8:50 AM.  Clovis Pu, RN

## 2020-07-01 ENCOUNTER — Ambulatory Visit (INDEPENDENT_AMBULATORY_CARE_PROVIDER_SITE_OTHER): Payer: 59 | Admitting: *Deleted

## 2020-07-01 VITALS — BP 116/79 | HR 101 | Temp 98.1°F | Ht 65.0 in | Wt 197.0 lb

## 2020-07-01 DIAGNOSIS — Z3201 Encounter for pregnancy test, result positive: Secondary | ICD-10-CM | POA: Diagnosis not present

## 2020-07-01 DIAGNOSIS — Z3480 Encounter for supervision of other normal pregnancy, unspecified trimester: Secondary | ICD-10-CM

## 2020-07-01 LAB — POCT URINE PREGNANCY: Preg Test, Ur: POSITIVE — AB

## 2020-07-01 NOTE — Progress Notes (Cosign Needed Addendum)
   PRENATAL INTAKE SUMMARY  Ms. Foye presents today New OB Nurse Interview.  OB History    Gravida  2   Para      Term      Preterm      AB  1   Living        SAB  1   IAB      Ectopic      Multiple      Live Births             I have reviewed the patient's medical, obstetrical, social, and family histories, medications, and available lab results.  SUBJECTIVE She has no unusual complaints  OBJECTIVE Initial nurse interview for history and labs (New OB)  EDD: 01/21/2021 GA: [redacted]w[redacted]d G2P0010  GENERAL APPEARANCE: alert, well appearing, in no apparent distress, oriented to person, place and time   ASSESSMENT Positive UPT Normal pregnancy  PLAN Prenatal care: [facility] Pnl/HIV/Hep C OB Urine Culture GC/CT/PAP at next visit with Edd Arbour, CNM 07/09/2020 HgbEval/SMA/CF ([facility]) to be competed at next visit Panoram to be completed at next visit A1C Ultrasound < 14 weeks ordered to confirm dating/viability Continue PNV BP monitor given Sign up for Babyscripts  Clovis Pu, RN

## 2020-07-02 LAB — CBC/D/PLT+RPR+RH+ABO+RUB AB...
Antibody Screen: NEGATIVE
Basophils Absolute: 0 10*3/uL (ref 0.0–0.2)
Basos: 0 %
EOS (ABSOLUTE): 0 10*3/uL (ref 0.0–0.4)
Eos: 0 %
HCV Ab: <0.1 s/co ratio (ref 0.0–0.9)
HIV Screen 4th Generation wRfx: NONREACTIVE
Hematocrit: 37.4 % (ref 34.0–46.6)
Hemoglobin: 12.8 g/dL (ref 11.1–15.9)
Hepatitis B Surface Ag: NEGATIVE
Immature Grans (Abs): 0.1 10*3/uL (ref 0.0–0.1)
Immature Granulocytes: 1 %
Lymphocytes Absolute: 1.7 10*3/uL (ref 0.7–3.1)
Lymphs: 17 %
MCH: 30 pg (ref 26.6–33.0)
MCHC: 34.2 g/dL (ref 31.5–35.7)
MCV: 88 fL (ref 79–97)
Monocytes Absolute: 0.7 10*3/uL (ref 0.1–0.9)
Monocytes: 7 %
Neutrophils Absolute: 7.3 10*3/uL — ABNORMAL HIGH (ref 1.4–7.0)
Neutrophils: 75 %
Platelets: 328 10*3/uL (ref 150–450)
RBC: 4.26 x10E6/uL (ref 3.77–5.28)
RDW: 11.7 % (ref 11.7–15.4)
RPR Ser Ql: NONREACTIVE
Rh Factor: POSITIVE
Rubella Antibodies, IGG: 17 index (ref >0.99)
WBC: 9.7 10*3/uL (ref 3.4–10.8)

## 2020-07-02 LAB — HCV INTERPRETATION

## 2020-07-02 LAB — HEMOGLOBIN A1C
Est. average glucose Bld gHb Est-mCnc: 114 mg/dL
Hgb A1c MFr Bld: 5.6 % (ref 4.8–5.6)

## 2020-07-03 LAB — URINE CULTURE, OB REFLEX

## 2020-07-03 LAB — CULTURE, OB URINE

## 2020-07-08 ENCOUNTER — Other Ambulatory Visit: Payer: PRIVATE HEALTH INSURANCE

## 2020-07-09 ENCOUNTER — Encounter: Payer: PRIVATE HEALTH INSURANCE | Admitting: Certified Nurse Midwife

## 2020-07-17 ENCOUNTER — Other Ambulatory Visit: Payer: Self-pay | Admitting: Obstetrics and Gynecology

## 2020-07-17 ENCOUNTER — Ambulatory Visit
Admission: RE | Admit: 2020-07-17 | Discharge: 2020-07-17 | Disposition: A | Discharge status: Home / Self Care | Payer: 59 | Source: Ambulatory Visit | Attending: Obstetrics and Gynecology | Admitting: Obstetrics and Gynecology

## 2020-07-17 ENCOUNTER — Other Ambulatory Visit: Payer: Self-pay

## 2020-07-17 DIAGNOSIS — Z3480 Encounter for supervision of other normal pregnancy, unspecified trimester: Secondary | ICD-10-CM

## 2020-07-24 ENCOUNTER — Encounter: Payer: Self-pay | Admitting: Obstetrics and Gynecology

## 2020-07-24 ENCOUNTER — Other Ambulatory Visit (HOSPITAL_COMMUNITY)
Admission: RE | Admit: 2020-07-24 | Discharge: 2020-07-24 | Disposition: A | Discharge status: Home / Self Care | Payer: 59 | Source: Ambulatory Visit | Attending: Obstetrics and Gynecology | Admitting: Obstetrics and Gynecology

## 2020-07-24 ENCOUNTER — Other Ambulatory Visit: Payer: Self-pay

## 2020-07-24 ENCOUNTER — Ambulatory Visit (INDEPENDENT_AMBULATORY_CARE_PROVIDER_SITE_OTHER): Payer: PRIVATE HEALTH INSURANCE | Admitting: Obstetrics and Gynecology

## 2020-07-24 VITALS — BP 104/69 | HR 116 | Temp 99.1°F | Wt 189.8 lb

## 2020-07-24 DIAGNOSIS — Z124 Encounter for screening for malignant neoplasm of cervix: Secondary | ICD-10-CM | POA: Diagnosis present

## 2020-07-24 DIAGNOSIS — Z3480 Encounter for supervision of other normal pregnancy, unspecified trimester: Secondary | ICD-10-CM

## 2020-07-24 DIAGNOSIS — L732 Hidradenitis suppurativa: Secondary | ICD-10-CM

## 2020-07-24 DIAGNOSIS — Z3A14 14 weeks gestation of pregnancy: Secondary | ICD-10-CM

## 2020-07-24 NOTE — Patient Instructions (Addendum)
Hidradenitis Suppurativa Hidradenitis suppurativa is a long-term (chronic) skin disease. It is similar to a severe form of acne, but it affects areas of the body where acne would be unusual, especially areas of the body where skin rubs against skin and becomes moist. These include:  Underarms.  Groin.  Genital area.  Buttocks.  Upper thighs.  Breasts. Hidradenitis suppurativa may start out as small lumps or pimples caused by blocked sweat glands or hair follicles. Pimples may develop into deep sores that break open (rupture) and drain pus. Over time, affected areas of skin may thicken and become scarred. This condition is rare and does not spread from person to person (non-contagious). What are the causes? The exact cause of this condition is not known. It may be related to:  Female and female hormones.  An overactive disease-fighting system (immune system). The immune system may over-react to blocked hair follicles or sweat glands and cause swelling and pus-filled sores. What increases the risk? You are more likely to develop this condition if you:  Are female.  Are 11-55 years old.  Have a family history of hidradenitis suppurativa.  Have a personal history of acne.  Are overweight.  Smoke.  Take the medicine lithium. What are the signs or symptoms? The first symptoms are usually painful bumps in the skin, similar to pimples. The condition may get worse over time (progress), or it may only cause mild symptoms. If the disease progresses, symptoms may include:  Skin bumps getting bigger and growing deeper into the skin.  Bumps rupturing and draining pus.  Itchy, infected skin.  Skin getting thicker and scarred.  Tunnels under the skin (fistulas) where pus drains from a bump.  Pain during daily activities, such as pain during walking if your groin area is affected.  Emotional problems, such as stress or depression. This condition may affect your appearance and your  ability or willingness to wear certain clothes or do certain activities. How is this diagnosed? This condition is diagnosed by a health care provider who specializes in skin diseases (dermatologist). You may be diagnosed based on:  Your symptoms and medical history.  A physical exam.  Testing a pus sample for infection.  Blood tests. How is this treated? Your treatment will depend on how severe your symptoms are. The same treatment will not work for everybody with this condition. You may need to try several treatments to find what works best for you. Treatment may include:  Cleaning and bandaging (dressing) your wounds as needed.  Lifestyle changes, such as new skin care routines.  Taking medicines, such as: ? Antibiotics. ? Acne medicines. ? Medicines to reduce the activity of the immune system. ? A diabetes medicine (metformin). ? Birth control pills, for women. ? Steroids to reduce swelling and pain.  Working with a mental health care provider, if you experience emotional distress due to this condition. If you have severe symptoms that do not get better with medicine, you may need surgery. Surgery may involve:  Using a laser to clear the skin and remove hair follicles.  Opening and draining deep sores.  Removing the areas of skin that are diseased and scarred. Follow these instructions at home: Medicines  Take over-the-counter and prescription medicines only as told by your health care provider.  If you were prescribed an antibiotic medicine, take it as told by your health care provider. Do not stop taking the antibiotic even if your condition improves.   Skin care  If you have open wounds,   cover them with a clean dressing as told by your health care provider. Keep wounds clean by washing them gently with soap and water when you bathe.  Do not shave the areas where you get hidradenitis suppurativa.  Do not wear deodorant.  Wear loose-fitting clothes.  Try to avoid  getting overheated or sweaty. If you get sweaty or wet, change into clean, dry clothes as soon as you can.  To help relieve pain and itchiness, cover sore areas with a warm, clean washcloth (warm compress) for 5-10 minutes as often as needed.  If told by your health care provider, take a bleach bath twice a week: ? Fill your bathtub halfway with water. ? Pour in  cup of unscented household bleach. ? Soak in the tub for 5-10 minutes. ? Only soak from the neck down. Avoid water on your face and hair. ? Shower to rinse off the bleach from your skin. General instructions  Learn as much as you can about your disease so that you have an active role in your treatment. Work closely with your health care provider to find treatments that work for you.  If you are overweight, work with your health care provider to lose weight as recommended.  Do not use any products that contain nicotine or tobacco, such as cigarettes and e-cigarettes. If you need help quitting, ask your health care provider.  If you struggle with living with this condition, talk with your health care provider or work with a mental health care provider as recommended.  Keep all follow-up visits as told by your health care provider. This is important. Where to find more information  Hidradenitis Suppurativa Foundation, Inc.: https://www.hs-foundation.org/  American Academy of Dermatology: InstantFinish.fi Contact a health care provider if you have:  A flare-up of hidradenitis suppurativa.  A fever or chills.  Trouble controlling your symptoms at home.  Trouble doing your daily activities because of your symptoms.  Trouble dealing with emotional problems related to your condition. Summary  Hidradenitis suppurativa is a long-term (chronic) skin disease. It is similar to a severe form of acne, but it affects areas of the body where acne would be unusual.  The first symptoms are usually painful bumps in the skin, similar  to pimples. The condition may only cause mild symptoms, or it may get worse over time (progress).  If you have open wounds, cover them with a clean dressing as told by your health care provider. Keep wounds clean by washing them gently with soap and water when you bathe.  Besides skin care, treatment may include medicines, laser treatment, and surgery. This information is not intended to replace advice given to you by your health care provider. Make sure you discuss any questions you have with your health care provider. Document Revised: 04/01/2020 Document Reviewed: 04/01/2020 Elsevier Patient Education  2021 Elsevier Inc. Second Trimester of Pregnancy  The second trimester of pregnancy is from week 13 through week 27. This is months 4 through 6 of pregnancy. The second trimester is often a time when you feel your best. Your body has adjusted to being pregnant, and you begin to feel better physically. During the second trimester:  Morning sickness has lessened or stopped completely.  You may have more energy.  You may have an increase in appetite. The second trimester is also a time when the unborn baby (fetus) is growing rapidly. At the end of the sixth month, the fetus may be up to 12 inches long and weigh about 1  pounds. You will likely begin to feel the baby move (quickening) between 16 and 20 weeks of pregnancy. Body changes during your second trimester Your body continues to go through many changes during your second trimester. The changes vary and generally return to normal after the baby is born. Physical changes  Your weight will continue to increase. You will notice your lower abdomen bulging out.  You may begin to get stretch marks on your hips, abdomen, and breasts.  Your breasts will continue to grow and to become tender.  Dark spots or blotches (chloasma or mask of pregnancy) may develop on your face.  A dark line from your belly button to the pubic area (linea nigra)  may appear.  You may have changes in your hair. These can include thickening of your hair, rapid growth, and changes in texture. Some people also have hair loss during or after pregnancy, or hair that feels dry or thin. Health changes  You may develop headaches.  You may have heartburn.  You may develop constipation.  You may develop hemorrhoids or swollen, bulging veins (varicose veins).  Your gums may bleed and may be sensitive to brushing and flossing.  You may urinate more often because the fetus is pressing on your bladder.  You may have back pain. This is caused by: ? Weight gain. ? Pregnancy hormones that are relaxing the joints in your pelvis. ? A shift in weight and the muscles that support your balance. Follow these instructions at home: Medicines  Follow your health care provider's instructions regarding medicine use. Specific medicines may be either safe or unsafe to take during pregnancy. Do not take any medicines unless approved by your health care provider.  Take a prenatal vitamin that contains at least 600 micrograms (mcg) of folic acid. Eating and drinking  Eat a healthy diet that includes fresh fruits and vegetables, whole grains, good sources of protein such as meat, eggs, or tofu, and low-fat dairy products.  Avoid raw meat and unpasteurized juice, milk, and cheese. These carry germs that can harm you and your baby.  You may need to take these actions to prevent or treat constipation: ? Drink enough fluid to keep your urine pale yellow. ? Eat foods that are high in fiber, such as beans, whole grains, and fresh fruits and vegetables. ? Limit foods that are high in fat and processed sugars, such as fried or sweet foods. Activity  Exercise only as directed by your health care provider. Most people can continue their usual exercise routine during pregnancy. Try to exercise for 30 minutes at least 5 days a week. Stop exercising if you develop contractions in  your uterus.  Stop exercising if you develop pain or cramping in the lower abdomen or lower back.  Avoid exercising if it is very hot or humid or if you are at a high altitude.  Avoid heavy lifting.  If you choose to, you may have sex unless your health care provider tells you not to. Relieving pain and discomfort  Wear a supportive bra to prevent discomfort from breast tenderness.  Take warm sitz baths to soothe any pain or discomfort caused by hemorrhoids. Use hemorrhoid cream if your health care provider approves.  Rest with your legs raised (elevated) if you have leg cramps or low back pain.  If you develop varicose veins: ? Wear support hose as told by your health care provider. ? Elevate your feet for 15 minutes, 3-4 times a day. ? Limit salt in  your diet. Safety  Wear your seat belt at all times when driving or riding in a car.  Talk with your health care provider if someone is verbally or physically abusive to you. Lifestyle  Do not use hot tubs, steam rooms, or saunas.  Do not douche. Do not use tampons or scented sanitary pads.  Avoid cat litter boxes and soil used by cats. These carry germs that can cause birth defects in the baby and possibly loss of the fetus by miscarriage or stillbirth.  Do not use herbal remedies, alcohol, illegal drugs, or medicines that are not approved by your health care provider. Chemicals in these products can harm your baby.  Do not use any products that contain nicotine or tobacco, such as cigarettes, e-cigarettes, and chewing tobacco. If you need help quitting, ask your health care provider. General instructions  During a routine prenatal visit, your health care provider will do a physical exam and other tests. He or she will also discuss your overall health. Keep all follow-up visits. This is important.  Ask your health care provider for a referral to a local prenatal education class.  Ask for help if you have counseling or  nutritional needs during pregnancy. Your health care provider can offer advice or refer you to specialists for help with various needs. Where to find more information  American Pregnancy Association: americanpregnancy.org  [employer]necologists: https://www.todd-brady.net/  Office on [employer] Health: MightyReward.co.nz Contact a health care provider if you have:  A headache that does not go away when you take medicine.  Vision changes or you see spots in front of your eyes.  Mild pelvic cramps, pelvic pressure, or nagging pain in the abdominal area.  Persistent nausea, vomiting, or diarrhea.  A bad-smelling vaginal discharge or foul-smelling urine.  Pain when you urinate.  Sudden or extreme swelling of your face, hands, ankles, feet, or legs.  A fever. Get help right away if you:  Have fluid leaking from your vagina.  Have spotting or bleeding from your vagina.  Have severe abdominal cramping or pain.  Have difficulty breathing.  Have chest pain.  Have fainting spells.  Have not felt your baby move for the time period told by your health care provider.  Have new or increased pain, swelling, or redness in an arm or leg. Summary  The second trimester of pregnancy is from week 13 through week 27 (months 4 through 6).  Do not use herbal remedies, alcohol, illegal drugs, or medicines that are not approved by your health care provider. Chemicals in these products can harm your baby.  Exercise only as directed by your health care provider. Most people can continue their usual exercise routine during pregnancy.  Keep all follow-up visits. This is important. This information is not intended to replace advice given to you by your health care provider. Make sure you discuss any questions you have with your health care provider. Document Revised: 11/14/2019 Document Reviewed: 09/20/2019 Elsevier Patient Education  2021 [employer].

## 2020-07-24 NOTE — Progress Notes (Signed)
INITIAL OBSTETRICAL VISIT Patient name: Amanda Rhodes MRN [id number]  Date of birth: Jan 02, 1991 Chief Complaint:   Initial Prenatal Visit  History of Present Illness:   Amanda Rhodes is a 30 y.o. G72P0010 African American female at [redacted]w[redacted]d by LMP with an Estimated Date of Delivery: 01/21/21 being seen today for her initial obstetrical visit.  Her obstetrical history is significant for history of SAB @ 6 wks in 2021; no complications. This is a planned pregnancy. She and the father of the baby (FOB) "Sheria Lang" live together. She has a support system that consists of the FOB/family/friends. Today she reports no complaints.   Patient's last menstrual period was 04/16/2020 (approximate). Last pap 2010. Results were: normal per patient Review of Systems:   Pertinent items are noted in HPI Denies cramping/contractions, leakage of fluid, vaginal bleeding, abnormal vaginal discharge w/ itching/odor/irritation, headaches, visual changes, shortness of breath, chest pain, abdominal pain, severe nausea/vomiting, or problems with urination or bowel movements unless otherwise stated above.  Pertinent History Reviewed:  Reviewed past medical,surgical, social, obstetrical and family history.  Reviewed problem list, medications and allergies. OB History  Gravida Para Term Preterm AB Living  2       1    SAB IAB Ectopic Multiple Live Births  1            # Outcome Date GA Lbr Len/2nd Weight Sex Delivery Anes PTL Lv  2 Current           1 SAB 07/2019           Physical Assessment:   Vitals:   07/24/20 0901  BP: 104/69  Pulse: (!) 116  Temp: 99.1 F (37.3 C)  Weight: 189 lb 12.8 oz (86.1 kg)  Body mass index is 31.58 kg/m.       Physical Examination:  General appearance - well appearing, and in no distress  Mental status - alert, oriented to person, place, and time  Psych:  She has a normal mood and affect  Skin - warm and dry, normal color, no suspicious lesions noted  Chest - effort  normal, all lung fields clear to auscultation bilaterally  Heart - normal rate and regular rhythm  Abdomen - soft, nontender  Extremities:  No swelling or varicosities noted  Pelvic - VULVA: normal appearing vulva with no masses, tenderness or lesions  VAGINA: normal appearing vagina with normal color and discharge, no lesions.   CERVIX: normal appearing cervix without discharge or lesions, no CMT  Thin prep pap is done with reflex HR HPV cotesting  FHTs by doppler: 164 bpm  Assessment & Plan:  1) Low-Risk Pregnancy G2P0010 at [redacted]w[redacted]d with an Estimated Date of Delivery: 01/21/21   2) Initial OB visit - Welcomed to practice and introduced self to patient in addition to discussing other advanced practice providers that she may be seeing at this practice - Congratulated patient - Anticipatory guidance on upcoming appointments - Educated on COVID19 and pregnancy and the integration of virtual appointments  - Educated on babyscripts app- patient reports she has not received email, encouraged to look in spam folder and to call office if she still has not received email - patient verbalizes understanding    3) Supervision of other normal pregnancy, antepartum - Cervicovaginal ancillary only( [facility]) - Cytology - PAP( [facility]) - Genetic Screening - [place] MFM OB COMP + 14 WK; Future  2. Pap smear for cervical cancer screening - Cytology - PAP( [facility])  3. Hidradenitis suppurativa -  Ambulatory referral to Dermatology  4. [redacted] weeks gestation of pregnancy    Meds: No orders of the defined types were placed in this encounter.   Initial labs obtained Continue prenatal vitamins Reviewed n/v relief measures and warning s/s to report Reviewed recommended weight gain based on pre-gravid BMI Encouraged well-balanced diet Genetic Screening discussed: ordered Cystic fibrosis, SMA, Fragile X screening discussed ordered The nature of [facility] with  multiple MDs and other Advanced Practice Providers was explained to patient; also emphasized that residents, students are part of our team.  Discussed optimized OB schedule and video visits. Advised can have an in-office visit whenever she feels she needs to be seen.  Does own BP cuff. Check BP weekly, let us know if >140/90. Advised to call during normal business hours and there is an after-hours nurse line available.    Follow-up: Return in about 4 weeks (around 08/21/2020) for Return OB - My Chart video.   Orders Placed This Encounter  Procedures  . [place] MFM OB COMP + 14 WK  . Genetic Screening  . Ambulatory referral to Dermatology    Raelyn Mora MSN, CNM 07/24/2020

## 2020-07-25 LAB — CERVICOVAGINAL ANCILLARY ONLY
Bacterial Vaginitis (gardnerella): NEGATIVE
Candida Glabrata: NEGATIVE
Candida Vaginitis: NEGATIVE
Chlamydia: NEGATIVE
Comment: NEGATIVE
Comment: NEGATIVE
Comment: NEGATIVE
Comment: NEGATIVE
Comment: NEGATIVE
Comment: NORMAL
Neisseria Gonorrhea: NEGATIVE
Trichomonas: NEGATIVE

## 2020-07-28 ENCOUNTER — Encounter: Payer: Self-pay | Admitting: *Deleted

## 2020-07-29 LAB — CYTOLOGY - PAP: Diagnosis: NEGATIVE

## 2020-08-05 ENCOUNTER — Encounter: Payer: Self-pay | Admitting: *Deleted

## 2020-08-18 ENCOUNTER — Encounter: Payer: Self-pay | Admitting: *Deleted

## 2020-08-19 ENCOUNTER — Telehealth: Payer: Self-pay | Admitting: *Deleted

## 2020-08-19 NOTE — Telephone Encounter (Signed)
Left voice message for patient to return nurse call regarding Horizon result.  Patient informed of need for Horizon redraw. Appt 08/21/20 at 8:10 AM for ROB.  Clovis Pu, RN

## 2020-08-19 NOTE — Telephone Encounter (Signed)
-----   Message from Raelyn Mora, [place] sent at 08/19/2020 10:06 AM EST ----- Please make the patient aware there was not enough sample for her genetic testing to be completed. Offer her to be redrawn according to [facility]'s process.

## 2020-08-21 ENCOUNTER — Encounter: Payer: PRIVATE HEALTH INSURANCE | Admitting: Obstetrics and Gynecology

## 2020-08-25 ENCOUNTER — Encounter: Payer: Self-pay | Admitting: Obstetrics and Gynecology

## 2020-08-29 ENCOUNTER — Ambulatory Visit: Payer: 59 | Attending: Obstetrics and Gynecology

## 2020-08-29 ENCOUNTER — Other Ambulatory Visit: Payer: Self-pay

## 2020-08-29 DIAGNOSIS — Z3480 Encounter for supervision of other normal pregnancy, unspecified trimester: Secondary | ICD-10-CM

## 2020-09-01 ENCOUNTER — Other Ambulatory Visit: Payer: Self-pay | Admitting: *Deleted

## 2020-09-01 DIAGNOSIS — Z362 Encounter for other antenatal screening follow-up: Secondary | ICD-10-CM

## 2020-09-26 ENCOUNTER — Other Ambulatory Visit: Payer: Self-pay

## 2020-09-26 ENCOUNTER — Ambulatory Visit: Payer: 59 | Attending: Obstetrics and Gynecology

## 2020-09-26 DIAGNOSIS — Z362 Encounter for other antenatal screening follow-up: Secondary | ICD-10-CM | POA: Diagnosis present

## 2020-10-22 ENCOUNTER — Ambulatory Visit (INDEPENDENT_AMBULATORY_CARE_PROVIDER_SITE_OTHER): Payer: 59 | Admitting: Obstetrics and Gynecology

## 2020-10-22 ENCOUNTER — Other Ambulatory Visit: Payer: Self-pay

## 2020-10-22 VITALS — BP 108/74 | HR 101 | Temp 97.9°F | Wt 208.0 lb

## 2020-10-22 DIAGNOSIS — Z3A27 27 weeks gestation of pregnancy: Secondary | ICD-10-CM

## 2020-10-22 DIAGNOSIS — Z3480 Encounter for supervision of other normal pregnancy, unspecified trimester: Secondary | ICD-10-CM

## 2020-10-22 NOTE — Progress Notes (Signed)
   PRENATAL VISIT NOTE  Subjective:  Amanda Rhodes is a 30 y.o. G2P0010 at [redacted]w[redacted]d being seen today for ongoing prenatal care.  She is currently monitored for the following issues for this low-risk pregnancy and has Supervision of other normal pregnancy, antepartum and Hidradenitis suppurativa on their problem list.  Patient reports no complaints.  Contractions: Not present. Vag. Bleeding: None.  Movement: Present. Denies leaking of fluid.   The following portions of the patient's history were reviewed and updated as appropriate: allergies, current medications, past family history, past medical history, past social history, past surgical history and problem list.   Objective:   Vitals:   10/22/20 0834  BP: 108/74  Pulse: (!) 101  Temp: 97.9 F (36.6 C)  Weight: 208 lb (94.3 kg)    Fetal Status: Fetal Heart Rate (bpm): 153 Fundal Height: 29 cm Movement: Present     General:  Alert, oriented and cooperative. Patient is in no acute distress.  Skin: Skin is warm and dry. No rash noted.   Cardiovascular: Normal heart rate noted  Respiratory: Normal respiratory effort, no problems with respiration noted  Abdomen: Soft, gravid, appropriate for gestational age.  Pain/Pressure: Absent     Pelvic: Cervical exam deferred        Extremities: Normal range of motion.  Edema: None  Mental Status: Normal mood and affect. Normal behavior. Normal judgment and thought content.   Assessment and Plan:  Pregnancy: G2P0010 at [redacted]w[redacted]d 1. Supervision of other normal pregnancy, antepartum  - Glucose Tolerance, 2 Hours w/1 Hour - HIV Antibody (routine testing w rflx) - RPR - CBC - She declined TDAP today.   2. [redacted] weeks gestation of pregnancy  - Glucose Tolerance, 2 Hours w/1 Hour - HIV Antibody (routine testing w rflx) - RPR - CBC  Preterm labor symptoms and general obstetric precautions including but not limited to vaginal bleeding, contractions, leaking of fluid and fetal movement were reviewed  in detail with the patient. Please refer to After Visit Summary for other counseling recommendations.   No follow-ups on file.  Future Appointments  Date Time Provider Department Center  10/30/2020  8:30 AM Raelyn Mora, CNM [facility] None  11/27/2020  2:10 PM Raelyn Mora, CNM [facility] None  12/24/2020  9:10 AM Raelyn Mora, CNM [facility] None  01/07/2021  9:30 AM Raelyn Mora, CNM [facility] None  01/15/2021  9:30 AM Raelyn Mora, CNM [facility] None    Venia Carbon, NP

## 2020-10-23 LAB — CBC
Hematocrit: 32.8 % — ABNORMAL LOW (ref 34.0–46.6)
Hemoglobin: 10.9 g/dL — ABNORMAL LOW (ref 11.1–15.9)
MCH: 30.8 pg (ref 26.6–33.0)
MCHC: 33.2 g/dL (ref 31.5–35.7)
MCV: 93 fL (ref 79–97)
Platelets: 234 10*3/uL (ref 150–450)
RBC: 3.54 x10E6/uL — ABNORMAL LOW (ref 3.77–5.28)
RDW: 12.7 % (ref 11.7–15.4)
WBC: 11.3 10*3/uL — ABNORMAL HIGH (ref 3.4–10.8)

## 2020-10-23 LAB — GLUCOSE TOLERANCE, 2 HOURS W/ 1HR
Glucose, 1 hour: 85 mg/dL (ref 65–179)
Glucose, 2 hour: 98 mg/dL (ref 65–152)
Glucose, Fasting: 95 mg/dL — ABNORMAL HIGH (ref 65–91)

## 2020-10-23 LAB — HIV ANTIBODY (ROUTINE TESTING W REFLEX): HIV Screen 4th Generation wRfx: NONREACTIVE

## 2020-10-23 LAB — RPR: RPR Ser Ql: NONREACTIVE

## 2020-10-24 ENCOUNTER — Encounter: Payer: Self-pay | Admitting: Obstetrics and Gynecology

## 2020-10-24 ENCOUNTER — Other Ambulatory Visit: Payer: Self-pay | Admitting: Obstetrics and Gynecology

## 2020-10-24 DIAGNOSIS — O24419 Gestational diabetes mellitus in pregnancy, unspecified control: Secondary | ICD-10-CM

## 2020-10-27 ENCOUNTER — Telehealth: Payer: Self-pay | Admitting: *Deleted

## 2020-10-27 NOTE — Telephone Encounter (Signed)
Left voice message stating patient has an upcoming appointment on Nov 11, 2020 at 11:00 AM for DM education. Will send Mychart message.  Clovis Pu, RN

## 2020-10-30 ENCOUNTER — Encounter: Payer: Self-pay | Admitting: Obstetrics and Gynecology

## 2020-10-30 ENCOUNTER — Telehealth (INDEPENDENT_AMBULATORY_CARE_PROVIDER_SITE_OTHER): Payer: 59 | Admitting: Obstetrics and Gynecology

## 2020-10-30 VITALS — Wt 203.0 lb

## 2020-10-30 DIAGNOSIS — Z3480 Encounter for supervision of other normal pregnancy, unspecified trimester: Secondary | ICD-10-CM

## 2020-10-30 DIAGNOSIS — O24419 Gestational diabetes mellitus in pregnancy, unspecified control: Secondary | ICD-10-CM

## 2020-10-30 DIAGNOSIS — Z3A28 28 weeks gestation of pregnancy: Secondary | ICD-10-CM

## 2020-10-30 NOTE — Progress Notes (Signed)
MY CHART VIDEO VIRTUAL OBSTETRICS VISIT ENCOUNTER NOTE  I connected with Teriah Landen on 10/30/20 at  8:30 AM EDT by My Chart video at home and verified that I am speaking with the correct person using two identifiers. Provider located at [employer] for [employer] at [place].   I discussed the limitations, risks, security and privacy concerns of performing an evaluation and management service by My Chart video and the availability of in person appointments. I also discussed with the patient that there may be a patient responsible charge related to this service. The patient expressed understanding and agreed to proceed.  Subjective:  Amanda Rhodes is a 30 y.o. G2P0010 at [redacted]w[redacted]d being followed for ongoing prenatal care.  She is currently monitored for the following issues for this high-risk pregnancy and has Supervision of other normal pregnancy, antepartum; Hidradenitis suppurativa; and Gestational diabetes on their problem list.  Patient reports she thought about what she had to eat the night before taking the 2 hr GTT. She reports she forgot she stayed up to 0200 and ate a candy bar, fruit snacks, drank juice and soda. Therefore, she was NOT truly fasting. She has not had an appointment with the diabetic educator and is not checking her blood sugar. She has misplaced her BP cuff and will need to locate it and send a message via MyChart with the results.. Reports fetal movement. Denies any contractions, bleeding or leaking of fluid.   The following portions of the patient's history were reviewed and updated as appropriate: allergies, current medications, past family history, past medical history, past social history, past surgical history and problem list.   Objective:   General:  Alert, oriented and cooperative.   Mental Status: Normal mood and affect perceived. Normal judgment and thought content.  Rest of physical exam deferred due to type of encounter  Wt 203 lb (92.1 kg)    LMP 04/16/2020 (Approximate)   BMI 33.78 kg/m  **Done by patient's own at home BP cuff and scale  Assessment and Plan:  Pregnancy: G2P0010 at 100w1d  1. Supervision of other normal pregnancy, antepartum - Anticipatory guidance for upcoming appointments - Recommend repeating GTT next week - Anticipatory guidance for 2 hr GTT - advised to fast after midnight without anything to eat or drink (except for water), will have fasting blood drawn, drink the glucola drink (flavor choices: orange, fruit punch or lemon-lime), have a visit with a provider during the first hour of testing, wait in the lab waiting room to have blood drawn at 1 hour and then 2 hours after finishing glucola drink.  2. Gestational diabetes mellitus (GDM), antepartum, gestational diabetes method of control unspecified - Not checking BS - To repeat 2 hr GTT  3. [redacted] weeks gestation of pregnancy   Preterm labor symptoms and general obstetric precautions including but not limited to vaginal bleeding, contractions, leaking of fluid and fetal movement were reviewed in detail with the patient.  I discussed the assessment and treatment plan with the patient. The patient was provided an opportunity to ask questions and all were answered. The patient agreed with the plan and demonstrated an understanding of the instructions. The patient was advised to call back or seek an in-person office evaluation/go to MAU at [facility] for any urgent or concerning symptoms. Please refer to After Visit Summary for other counseling recommendations.   I provided 10 minutes of non-face-to-face time during this encounter. There was 5 minutes of chart review time spent prior to  this encounter. Total time spent = 15 minutes.  No follow-ups on file.  Future Appointments  Date Time Provider Department Center  11/05/2020  8:30 AM [facility] None  11/11/2020 11:15 AM [facility]  11/27/2020  2:10 PM Raelyn Mora, CNM [facility] None  12/24/2020  9:10 AM Raelyn Mora, CNM [facility] None  01/07/2021  9:30 AM Raelyn Mora, CNM [facility] None  01/15/2021  9:30 AM Raelyn Mora, CNM [facility] None    Raelyn Mora, CNM Center for [employer], [facility]

## 2020-11-05 ENCOUNTER — Other Ambulatory Visit: Payer: Self-pay

## 2020-11-05 ENCOUNTER — Other Ambulatory Visit (INDEPENDENT_AMBULATORY_CARE_PROVIDER_SITE_OTHER): Payer: 59 | Admitting: *Deleted

## 2020-11-05 DIAGNOSIS — Z3480 Encounter for supervision of other normal pregnancy, unspecified trimester: Secondary | ICD-10-CM

## 2020-11-05 NOTE — Progress Notes (Signed)
   Patient in clinic to repeat 2 hour gtt.    Clovis Pu, RN

## 2020-11-06 ENCOUNTER — Encounter: Payer: Self-pay | Admitting: Obstetrics and Gynecology

## 2020-11-06 LAB — GLUCOSE TOLERANCE, 2 HOURS W/ 1HR
Glucose, 1 hour: 114 mg/dL (ref 65–179)
Glucose, 2 hour: 113 mg/dL (ref 65–152)
Glucose, Fasting: 83 mg/dL (ref 65–91)

## 2020-11-11 ENCOUNTER — Other Ambulatory Visit: Payer: 59

## 2020-11-27 ENCOUNTER — Telehealth (INDEPENDENT_AMBULATORY_CARE_PROVIDER_SITE_OTHER): Payer: 59 | Admitting: Obstetrics and Gynecology

## 2020-11-27 ENCOUNTER — Encounter: Payer: Self-pay | Admitting: Obstetrics and Gynecology

## 2020-11-27 ENCOUNTER — Other Ambulatory Visit: Payer: Self-pay

## 2020-11-27 VITALS — BP 120/81 | HR 112 | Wt 203.0 lb

## 2020-11-27 DIAGNOSIS — Z3483 Encounter for supervision of other normal pregnancy, third trimester: Secondary | ICD-10-CM

## 2020-11-27 DIAGNOSIS — Z3A32 32 weeks gestation of pregnancy: Secondary | ICD-10-CM

## 2020-11-27 DIAGNOSIS — Z3480 Encounter for supervision of other normal pregnancy, unspecified trimester: Secondary | ICD-10-CM

## 2020-11-27 NOTE — Progress Notes (Signed)
MY CHART VIDEO VIRTUAL OBSTETRICS VISIT ENCOUNTER NOTE  I connected with Amanda Rhodes on 11/27/20 at  2:10 PM EDT by My Chart video at home and verified that I am speaking with the correct person using two identifiers. Provider located at [employer] for [employer] at [place].   I discussed the limitations, risks, security and privacy concerns of performing an evaluation and management service by My Chart video and the availability of in person appointments. I also discussed with the patient that there may be a patient responsible charge related to this service. The patient expressed understanding and agreed to proceed.  I discussed the limitations of telemedicine and the availability of in person appointments.  Discussed there is a possibility of technology failure and discussed alternative modes of communication if that failure occurs.  Subjective:  Amanda Rhodes is a 30 y.o. G2P0010 at [redacted]w[redacted]d being followed for ongoing prenatal care.  She is currently monitored for the following issues for this low-risk pregnancy and has Supervision of other normal pregnancy, antepartum and Hidradenitis suppurativa on their problem list.  Patient reports no complaints. Reports fetal movement. Denies any contractions, bleeding or leaking of fluid.   The following portions of the patient's history were reviewed and updated as appropriate: allergies, current medications, past family history, past medical history, past social history, past surgical history and problem list.   Objective:   General:  Alert, oriented and cooperative.   Mental Status: Normal mood and affect perceived. Normal judgment and thought content.  Rest of physical exam deferred due to type of encounter  BP 120/81   Pulse (!) 112   Wt 203 lb (92.1 kg)   LMP 04/16/2020 (Approximate)   BMI 33.78 kg/m  **Done by patient's own at home BP cuff and scale  Assessment and Plan:  Pregnancy: G2P0010 at [redacted]w[redacted]d  1. Supervision of  other normal pregnancy, antepartum - Anticipatory guidance for GBS screening at 36 wks. Explained the test is important to be done at this time in pregnancy to ensure adequate treatment at the time of delivery. Explained that a positive result does not mean any harm to her, but can be harmful to the baby. Meaning that if baby is exposed to the bacteria for too long without antibiotics, the bay has the potential to develop pneumonia, septicemia, or spinal meningitis and could end up in the NICU. Also, explained that a cervical exam may be performed at the time of testing to get a baseline cervical check and make sure there is no preterm cervical dilation.  2. [redacted] weeks gestation of pregnancy   Preterm labor symptoms and general obstetric precautions including but not limited to vaginal bleeding, contractions, leaking of fluid and fetal movement were reviewed in detail with the patient.  I discussed the assessment and treatment plan with the patient. The patient was provided an opportunity to ask questions and all were answered. The patient agreed with the plan and demonstrated an understanding of the instructions. The patient was advised to call back or seek an in-person office evaluation/go to MAU at [facility] for any urgent or concerning symptoms. Please refer to After Visit Summary for other counseling recommendations.   I provided 5 minutes of non-face-to-face time during this encounter. There was 5 minutes of chart review time spent prior to this encounter. Total time spent = 10 minutes.  Return in about 4 weeks (around 12/25/2020) for Return OB w/GBS.  Future Appointments  Date Time Provider Department Center  12/24/2020  9:10 AM Raelyn Mora, CNM [facility] None  01/07/2021  9:30 AM Raelyn Mora, CNM [facility] None  01/15/2021  9:30 AM Raelyn Mora, CNM [facility] None  01/22/2021 10:10 AM Raelyn Mora, CNM [facility] None    Raelyn Mora, CNM Center for [employer],  [facility]

## 2020-12-24 ENCOUNTER — Ambulatory Visit (INDEPENDENT_AMBULATORY_CARE_PROVIDER_SITE_OTHER): Payer: 59 | Admitting: Obstetrics and Gynecology

## 2020-12-24 ENCOUNTER — Other Ambulatory Visit: Payer: Self-pay

## 2020-12-24 ENCOUNTER — Other Ambulatory Visit (HOSPITAL_COMMUNITY)
Admission: RE | Admit: 2020-12-24 | Discharge: 2020-12-24 | Disposition: A | Discharge status: Home / Self Care | Payer: 59 | Source: Ambulatory Visit | Attending: Obstetrics and Gynecology | Admitting: Obstetrics and Gynecology

## 2020-12-24 ENCOUNTER — Encounter: Payer: Self-pay | Admitting: Obstetrics and Gynecology

## 2020-12-24 VITALS — BP 108/71 | HR 113 | Temp 98.5°F | Wt 207.4 lb

## 2020-12-24 DIAGNOSIS — Z3A36 36 weeks gestation of pregnancy: Secondary | ICD-10-CM

## 2020-12-24 DIAGNOSIS — Z3480 Encounter for supervision of other normal pregnancy, unspecified trimester: Secondary | ICD-10-CM | POA: Diagnosis not present

## 2020-12-24 DIAGNOSIS — O99019 Anemia complicating pregnancy, unspecified trimester: Secondary | ICD-10-CM | POA: Insufficient documentation

## 2020-12-24 MED ORDER — ASCORBIC ACID 500 MG PO TABS: 500.0000 mg | ORAL_TABLET | ORAL | 3 refills | Status: DC

## 2020-12-24 MED ORDER — FERROUS SULFATE 325 (65 FE) MG PO TBEC: 325.0000 mg | DELAYED_RELEASE_TABLET | ORAL | 2 refills | Status: AC

## 2020-12-24 NOTE — Patient Instructions (Signed)
Pregnancy and Anemia  Anemia is a condition in which there is not enough red blood cells or hemoglobin in the blood. Hemoglobin is a substance in red blood cells that carries oxygen. When you do not have enough red blood cells or hemoglobin (are anemic), your body cannot get enough oxygen and your organs may not work properly. Anemia is common during pregnancy because your body needs more blood volume andblood cells to provide nutrition to the unborn baby. What are the causes? The most common cause of anemia during pregnancy is not having enough iron in the body to make red blood cells (iron deficiency anemia). Other causes may include: Folic acid deficiency. Vitamin B12 deficiency. Certain prescription or over-the-counter medicines. Certain medical conditions or infections that destroy red blood cells. A low platelet count and bleeding caused by antibodies that go through the placenta to the baby from the mother's blood. What are the signs or symptoms? Mild anemia may not cause any symptoms. If anemia becomes severe, symptoms may include: Feeling tired or weak. Shortness of breath, especially during activity. Fainting. Pale skin. Headaches. A fast or irregular heartbeat. Dizziness. How is this diagnosed? This condition may be diagnosed based on your medical history and a physicalexam. You may also have blood tests. How is this treated? Treatment for anemia during pregnancy depends on the cause of the anemia. Treatment may include: Making changes to your diet. Taking iron, vitamin B12, or folic acid supplements. Having a blood transfusion. This may be needed if the anemia is severe. Follow these instructions at home: Eating and drinking Follow recommendations from your health care provider about changing your diet. Eat a diet rich in iron. This would include foods such as: Liver. Beef. Eggs. Whole grains. Spinach. Dried fruit. Increase your vitamin C intake. This will help the  stomach absorb more iron. Some foods that are high in vitamin C include: Oranges. Peppers. Tomatoes. Mangoes. Eat green leafy vegetables. These are a good source of folic acid. General instructions Take iron supplements and vitamins as told by your health care provider. Keep all follow-up visits. This is important. Contact a health care provider if: You have headaches that happen often or do not go away. You bruise easily. You have a fever. You have nausea and vomiting for more than 24 hours. You are unable to take supplements prescribed to treat your anemia. Get help right away if: You develop signs or symptoms of severe anemia. You have bleeding from your vagina. You develop a rash. You have bloody or tarry stools. You are very dizzy or you faint. Summary Anemia is a condition in which there is not enough red blood cells or hemoglobin in the blood. The most common cause of anemia during pregnancy is not having enough iron in the body to make red blood cells (iron deficiency anemia). Mild anemia may not cause any symptoms. If it becomes severe, symptoms may include feeling tired and weak. Take iron supplements and vitamins as told by your health care provider. Keep all follow-up visits. This is important. This information is not intended to replace advice given to you by your health care provider. Make sure you discuss any questions you have with your healthcare provider. Document Revised: 10/09/2019 Document Reviewed: 10/09/2019 Elsevier Patient Education  2022 [employer].

## 2020-12-24 NOTE — Progress Notes (Signed)
   LOW-RISK PREGNANCY OFFICE VISIT Patient name: Amanda Rhodes MRN [id number]  Date of birth: 09-05-1990 Chief Complaint:   Routine Prenatal Visit  History of Present Illness:   Amanda Rhodes is a 30 y.o. G35P0010 female at [redacted]w[redacted]d with an Estimated Date of Delivery: 01/21/21 being seen today for ongoing management of a low-risk pregnancy.  Today she reports no complaints. Contractions: Not present. Vag. Bleeding: None.  Movement: Present. denies leaking of fluid. Review of Systems:   Pertinent items are noted in HPI Denies abnormal vaginal discharge w/ itching/odor/irritation, headaches, visual changes, shortness of breath, chest pain, abdominal pain, severe nausea/vomiting, or problems with urination or bowel movements unless otherwise stated above. Pertinent History Reviewed:  Reviewed past medical,surgical, social, obstetrical and family history.  Reviewed problem list, medications and allergies. Physical Assessment:   Vitals:   12/24/20 0922  BP: 108/71  Pulse: (!) 113  Temp: 98.5 F (36.9 C)  Weight: 207 lb 6.4 oz (94.1 kg)  Body mass index is 34.51 kg/m.        Physical Examination:   General appearance: Well appearing, and in no distress  Mental status: Alert, oriented to person, place, and time  Skin: Warm & dry  Cardiovascular: Normal heart rate noted  Respiratory: Normal respiratory effort, no distress  Abdomen: Soft, gravid, nontender  Pelvic: Cervical exam performed  Dilation: 1 Effacement (%): 50 Station: -3  Extremities: Edema: None  Fetal Status: Fetal Heart Rate (bpm): 147 Fundal Height: 34 cm Movement: Present Presentation: Vertex  No results found for this or any previous visit (from the past 24 hour(s)).  Assessment & Plan:  1) Low-risk pregnancy G2P0010 at [redacted]w[redacted]d with an Estimated Date of Delivery: 01/21/21   2) Supervision of other normal pregnancy, antepartum  - Culture, beta strep (group b only),  - Cervicovaginal ancillary only( [place])  3)  Anemia during pregnancy  - Rx for ferrous sulfate 325 (65 FE) MG EC tablet,  - Rx for ascorbic acid (VITAMIN C) 500 MG tablet  4) [redacted] weeks gestation of pregnancy    Meds:  Meds ordered this encounter  Medications   ferrous sulfate 325 (65 FE) MG EC tablet    Sig: Take 1 tablet (325 mg total) by mouth every other day. Take with Vitamin C    Dispense:  60 tablet    Refill:  2    Order Specific Question:   Supervising Provider    Answer:   Reva Bores [[id number]]   ascorbic acid (VITAMIN C) 500 MG tablet    Sig: Take 1 tablet (500 mg total) by mouth every other day. Take with iron pill    Dispense:  30 tablet    Refill:  3    Order Specific Question:   Supervising Provider    Answer:   Reva Bores [[id number]]   Labs/procedures today: GBS, GC/CT, cervical exam  Plan:  Continue routine obstetrical care   Reviewed: Preterm labor symptoms and general obstetric precautions including but not limited to vaginal bleeding, contractions, leaking of fluid and fetal movement were reviewed in detail with the patient.  All questions were answered. Has home bp cuff. Check bp weekly, let us know if >140/90.   Follow-up: No follow-ups on file.  Orders Placed This Encounter  Procedures   Culture, beta strep (group b only)   Raelyn Mora MSN, CNM 12/24/2020 8:43 PM

## 2020-12-25 LAB — CERVICOVAGINAL ANCILLARY ONLY
Chlamydia: NEGATIVE
Comment: NEGATIVE
Comment: NEGATIVE
Comment: NORMAL
Neisseria Gonorrhea: NEGATIVE
Trichomonas: NEGATIVE

## 2020-12-28 LAB — CULTURE, BETA STREP (GROUP B ONLY): Strep Gp B Culture: NEGATIVE

## 2021-01-07 ENCOUNTER — Other Ambulatory Visit: Payer: Self-pay

## 2021-01-07 ENCOUNTER — Ambulatory Visit (INDEPENDENT_AMBULATORY_CARE_PROVIDER_SITE_OTHER): Payer: 59 | Admitting: Obstetrics and Gynecology

## 2021-01-07 ENCOUNTER — Encounter: Payer: Self-pay | Admitting: Obstetrics and Gynecology

## 2021-01-07 VITALS — BP 110/72 | HR 109 | Temp 98.4°F | Wt 212.8 lb

## 2021-01-07 DIAGNOSIS — O99019 Anemia complicating pregnancy, unspecified trimester: Secondary | ICD-10-CM

## 2021-01-07 DIAGNOSIS — Z3A38 38 weeks gestation of pregnancy: Secondary | ICD-10-CM

## 2021-01-07 DIAGNOSIS — Z3480 Encounter for supervision of other normal pregnancy, unspecified trimester: Secondary | ICD-10-CM

## 2021-01-07 NOTE — Progress Notes (Signed)
   LOW-RISK PREGNANCY OFFICE VISIT Patient name: Amanda Rhodes MRN [id number]  Date of birth: May 03, 1991 Chief Complaint:   Routine Prenatal Visit  History of Present Illness:   Amanda Rhodes is a 30 y.o. G78P0010 female at [redacted]w[redacted]d with an Estimated Date of Delivery: 01/21/21 being seen today for ongoing management of a low-risk pregnancy.  Today she reports occasional contractions. Contractions: Not present. Vag. Bleeding: None.  Movement: Present. denies leaking of fluid. Review of Systems:   Pertinent items are noted in HPI Denies abnormal vaginal discharge w/ itching/odor/irritation, headaches, visual changes, shortness of breath, chest pain, abdominal pain, severe nausea/vomiting, or problems with urination or bowel movements unless otherwise stated above. Pertinent History Reviewed:  Reviewed past medical,surgical, social, obstetrical and family history.  Reviewed problem list, medications and allergies. Physical Assessment:   Vitals:   01/07/21 0942  BP: 110/72  Pulse: (!) 109  Temp: 98.4 F (36.9 C)  Weight: 212 lb 12.8 oz (96.5 kg)  Body mass index is 35.41 kg/m.        Physical Examination:   General appearance: Well appearing, and in no distress  Mental status: Alert, oriented to person, place, and time  Skin: Warm & dry  Cardiovascular: Normal heart rate noted  Respiratory: Normal respiratory effort, no distress  Abdomen: Soft, gravid, nontender  Pelvic: Cervical exam performed  Dilation: 1.5 Effacement (%): 60 Station: -3  Extremities: Edema: None  Fetal Status: Fetal Heart Rate (bpm): 150 Fundal Height: 38 cm Movement: Present Presentation: Vertex  No results found for this or any previous visit (from the past 24 hour(s)).  Assessment & Plan:  1) Low-risk pregnancy G2P0010 at [redacted]w[redacted]d with an Estimated Date of Delivery: 01/21/21   2) Supervision of other normal pregnancy, antepartum - Reviewed birth plan on phone. Desires to keep placenta, but does not have a way to  get it encapsulated. - Advised her to print it out or send via MyChart message - Given name for doula Precious Elige Radon. Advised to look her up on social media.  3) Anemia during pregnancy - Taking iron supplements  4) [redacted] weeks gestation of pregnancy    Meds: No orders of the defined types were placed in this encounter.  Labs/procedures today: cervical check  Plan:  Continue routine obstetrical care   Reviewed: Term labor symptoms and general obstetric precautions including but not limited to vaginal bleeding, contractions, leaking of fluid and fetal movement were reviewed in detail with the patient.  All questions were answered. Has home bp cuff. Check bp weekly, let us know if >140/90.   Follow-up: Return in about 1 week (around 01/14/2021) for Return OB visit.  No orders of the defined types were placed in this encounter.  Raelyn Mora MSN, CNM 01/07/2021 10:55 AM

## 2021-01-15 ENCOUNTER — Other Ambulatory Visit: Payer: Self-pay | Admitting: Obstetrics and Gynecology

## 2021-01-15 ENCOUNTER — Ambulatory Visit (INDEPENDENT_AMBULATORY_CARE_PROVIDER_SITE_OTHER): Payer: 59 | Admitting: Obstetrics and Gynecology

## 2021-01-15 ENCOUNTER — Other Ambulatory Visit: Payer: Self-pay

## 2021-01-15 ENCOUNTER — Encounter: Payer: Self-pay | Admitting: Obstetrics and Gynecology

## 2021-01-15 VITALS — BP 109/75 | HR 102 | Temp 98.5°F | Wt 209.8 lb

## 2021-01-15 DIAGNOSIS — O99019 Anemia complicating pregnancy, unspecified trimester: Secondary | ICD-10-CM

## 2021-01-15 DIAGNOSIS — Z3480 Encounter for supervision of other normal pregnancy, unspecified trimester: Secondary | ICD-10-CM

## 2021-01-15 DIAGNOSIS — Z3A39 39 weeks gestation of pregnancy: Secondary | ICD-10-CM

## 2021-01-15 NOTE — Progress Notes (Signed)
IOL Orders entered 

## 2021-01-15 NOTE — Progress Notes (Addendum)
   LOW-RISK PREGNANCY OFFICE VISIT Patient name: Amanda Rhodes MRN [id number]  Date of birth: August 26, 1990 Chief Complaint:   Routine Prenatal Visit  History of Present Illness:   Amanda Rhodes is a 30 y.o. G66P0010 female at [redacted]w[redacted]d with an Estimated Date of Delivery: 01/21/21 being seen today for ongoing management of a low-risk pregnancy.  Today she reports occasional contractions and increased pelvic pressure . Contractions: Not present. Vag. Bleeding: None.  Movement: Present. denies leaking of fluid. Review of Systems:   Pertinent items are noted in HPI Denies abnormal vaginal discharge w/ itching/odor/irritation, headaches, visual changes, shortness of breath, chest pain, abdominal pain, severe nausea/vomiting, or problems with urination or bowel movements unless otherwise stated above. Pertinent History Reviewed:  Reviewed past medical,surgical, social, obstetrical and family history.  Reviewed problem list, medications and allergies. Physical Assessment:   Vitals:   01/15/21 0957  BP: 109/75  Pulse: (!) 102  Temp: 98.5 F (36.9 C)  Weight: 209 lb 12.8 oz (95.2 kg)  Body mass index is 34.91 kg/m.        Physical Examination:   General appearance: Well appearing, and in no distress  Mental status: Alert, oriented to person, place, and time  Skin: Warm & dry  Cardiovascular: Normal heart rate noted  Respiratory: Normal respiratory effort, no distress  Abdomen: Soft, gravid, nontender  Pelvic: Cervical exam deferred - Patient DECLINED        Extremities: Edema: None  Fetal Status: Fetal Heart Rate (bpm): 157   Movement: Present    No results found for this or any previous visit (from the past 24 hour(s)).  Assessment & Plan:  1) Low-risk pregnancy G2P0010 at [redacted]w[redacted]d with an Estimated Date of Delivery: 01/21/21   2) Supervision of other normal pregnancy, antepartum - Discussed methods to stimulate labor: [employer], RRLT, EPO, SI  3) Anemia during pregnancy - Taking  iron and vitamin c daily  4) [redacted] weeks gestation of pregnancy    Meds: No orders of the defined types were placed in this encounter.  Labs/procedures today: none  Plan:  Continue routine obstetrical care   Reviewed: Term labor symptoms and general obstetric precautions including but not limited to vaginal bleeding, contractions, leaking of fluid and fetal movement were reviewed in detail with the patient.  All questions were answered. Has home bp cuff.Check bp weekly, let us know if >140/90.   Follow-up: Return in about 1 week (around 01/22/2021) for Return OB visit.  No orders of the defined types were placed in this encounter.  Raelyn Mora MSN, CNM 01/15/2021 10:04 AM

## 2021-01-15 NOTE — Patient Instructions (Signed)
Signs and Symptoms of Labor Labor is the body's natural process of moving the baby and the placenta out of the uterus. The process of labor usually starts when the baby is full-term, between 37 and 40 weeks of pregnancy. Signs and symptoms that you are close to going into labor As your body prepares for labor and the birth of your baby, you may notice the following symptoms in the weeks and days before true labor starts: Passing a small amount of thick, bloody mucus from your vagina. This is called normal bloody show or losing your mucus plug. This may happen more than a week before labor begins, or right before labor begins, as the opening of the cervix starts to widen (dilate). For some women, the entire mucus plug passes at once. For others, pieces of the mucus plug may gradually pass over several days. Your baby moving (dropping) lower in your pelvis to get into position for birth (lightening). When this happens, you may feel more pressure on your bladder and pelvic bone and less pressure on your ribs. This may make it easier to breathe. It may also cause you to need to urinate more often and have problems with bowel movements. Having "practice contractions," also called Braxton Hicks contractions or false labor. These occur at irregular (unevenly spaced) intervals that are more than 10 minutes apart. False labor contractions are common after exercise or sexual activity. They will stop if you change position, rest, or drink fluids. These contractions are usually mild and do not get stronger over time. They may feel like: A backache or back pain. Mild cramps, similar to menstrual cramps. Tightening or pressure in your abdomen. Other early symptoms include: Nausea or loss of appetite. Diarrhea. Having a sudden burst of energy, or feeling very tired. Mood changes. Having trouble sleeping. Signs and symptoms that labor has begun Signs that you are in labor may include: Having contractions that come  at regular (evenly spaced) intervals and increase in intensity. This may feel like more intense tightening or pressure in your abdomen that moves to your back. Contractions may also feel like rhythmic pain in your upper thighs or back that comes and goes at regular intervals. For first-time mothers, this change in intensity of contractions often occurs at a more gradual pace. Women who have given birth before may notice a more rapid progression of contraction changes. Feeling pressure in the vaginal area. Your water breaking (rupture of membranes). This is when the sac of fluid that surrounds your baby breaks. Fluid leaking from your vagina may be clear or blood-tinged. Labor usually starts within 24 hours of your water breaking, but it may take longer to begin. Some women may feel a sudden gush of fluid. Others notice that their underwear repeatedly becomes damp. Follow these instructions at home:  When labor starts, or if your water breaks, call your health care provider or nurse care line. Based on your situation, they will determine when you should go in for an exam. During early labor, you may be able to rest and manage symptoms at home. Some strategies to try at home include: Breathing and relaxation techniques. Taking a warm bath or shower. Listening to music. Using a heating pad on the lower back for pain. If you are directed to use heat: Place a towel between your skin and the heat source. Leave the heat on for 20-30 minutes. Remove the heat if your skin turns bright red. This is especially important if you are unable to   feel pain, heat, or cold. You may have a greater risk of getting burned. Contact a health care provider if: Your labor has started. Your water breaks. Get help right away if: You have painful, regular contractions that are 5 minutes apart or less. Labor starts before you are [redacted] weeks along in your pregnancy. You have a fever. You have bright red blood coming from  your vagina. You do not feel your baby moving. You have a severe headache with or without vision problems. You have severe nausea, vomiting, or diarrhea. You have chest pain or shortness of breath. These symptoms may represent a serious problem that is an emergency. Do not wait to see if the symptoms will go away. Get medical help right away. Call your local emergency services (911 in the [place] Do not drive yourself to the hospital. Summary Labor is your body's natural process of moving your baby and the placenta out of your uterus. The process of labor usually starts when your baby is full-term, between 37 and 40 weeks of pregnancy. When labor starts, or if your water breaks, call your health care provider or nurse care line. Based on your situation, they will determine when you should go in for an exam. This information is not intended to replace advice given to you by your health care provider. Make sure you discuss any questions you have with your health care provider. Document Revised: 03/29/2020 Document Reviewed: 03/29/2020 Elsevier Patient Education  2022 [employer]  

## 2021-01-20 ENCOUNTER — Inpatient Hospital Stay (HOSPITAL_COMMUNITY)
Admission: AD | Admit: 2021-01-20 | Discharge: 2021-01-22 | DRG: 807 | Disposition: A | Discharge status: Home / Self Care | Payer: 59 | Attending: Obstetrics and Gynecology | Admitting: Obstetrics and Gynecology

## 2021-01-20 ENCOUNTER — Inpatient Hospital Stay (HOSPITAL_COMMUNITY): Payer: 59 | Admitting: Anesthesiology

## 2021-01-20 ENCOUNTER — Encounter (HOSPITAL_COMMUNITY): Payer: Self-pay

## 2021-01-20 ENCOUNTER — Other Ambulatory Visit: Payer: Self-pay

## 2021-01-20 ENCOUNTER — Encounter (HOSPITAL_COMMUNITY): Payer: Self-pay | Admitting: Obstetrics and Gynecology

## 2021-01-20 ENCOUNTER — Other Ambulatory Visit: Payer: Self-pay | Admitting: Advanced Practice Midwife

## 2021-01-20 ENCOUNTER — Telehealth (HOSPITAL_COMMUNITY): Payer: Self-pay | Admitting: *Deleted

## 2021-01-20 DIAGNOSIS — Z3480 Encounter for supervision of other normal pregnancy, unspecified trimester: Secondary | ICD-10-CM

## 2021-01-20 DIAGNOSIS — O99019 Anemia complicating pregnancy, unspecified trimester: Secondary | ICD-10-CM

## 2021-01-20 DIAGNOSIS — O26893 Other specified pregnancy related conditions, third trimester: Secondary | ICD-10-CM | POA: Diagnosis present

## 2021-01-20 DIAGNOSIS — Z3A39 39 weeks gestation of pregnancy: Secondary | ICD-10-CM | POA: Diagnosis not present

## 2021-01-20 DIAGNOSIS — Z20822 Contact with and (suspected) exposure to covid-19: Secondary | ICD-10-CM | POA: Diagnosis present

## 2021-01-20 LAB — CBC
HCT: 33.8 % — ABNORMAL LOW (ref 36.0–46.0)
Hemoglobin: 11.5 g/dL — ABNORMAL LOW (ref 12.0–15.0)
MCH: 30.1 pg (ref 26.0–34.0)
MCHC: 34 g/dL (ref 30.0–36.0)
MCV: 88.5 fL (ref 80.0–100.0)
Platelets: 265 10*3/uL (ref 150–400)
RBC: 3.82 MIL/uL — ABNORMAL LOW (ref 3.87–5.11)
RDW: 14 % (ref 11.5–15.5)
WBC: 16.3 10*3/uL — ABNORMAL HIGH (ref 4.0–10.5)
nRBC: 0 % (ref 0.0–0.2)

## 2021-01-20 LAB — WET PREP, GENITAL
Clue Cells Wet Prep HPF POC: NONE SEEN
Sperm: NONE SEEN
Trich, Wet Prep: NONE SEEN
Yeast Wet Prep HPF POC: NONE SEEN

## 2021-01-20 LAB — TYPE AND SCREEN
ABO/RH(D): O POS
Antibody Screen: NEGATIVE

## 2021-01-20 LAB — RESP PANEL BY RT-PCR (FLU A&B, COVID) ARPGX2
Influenza A by PCR: NEGATIVE
Influenza B by PCR: NEGATIVE
SARS Coronavirus 2 by RT PCR: NEGATIVE

## 2021-01-20 LAB — POCT FERN TEST: POCT Fern Test: NEGATIVE

## 2021-01-20 MED ORDER — OXYCODONE-ACETAMINOPHEN 5-325 MG PO TABS: 2.0000 | ORAL_TABLET | ORAL | Status: DC | PRN

## 2021-01-20 MED ORDER — BUPIVACAINE HCL (PF) 0.25 % IJ SOLN
INTRAMUSCULAR | Status: DC | PRN
  Administered 2021-01-20: 7 mL via EPIDURAL

## 2021-01-20 MED ORDER — LACTATED RINGERS IV SOLN: INTRAVENOUS | Status: DC

## 2021-01-20 MED ORDER — ONDANSETRON HCL 4 MG/2ML IJ SOLN: 4.0000 mg | Freq: Four times a day (QID) | INTRAMUSCULAR | Status: DC | PRN

## 2021-01-20 MED ORDER — LACTATED RINGERS IV SOLN
500.0000 mL | Freq: Once | INTRAVENOUS | Status: AC
  Administered 2021-01-20: 500 mL via INTRAVENOUS

## 2021-01-20 MED ORDER — OXYTOCIN BOLUS FROM INFUSION
333.0000 mL | Freq: Once | INTRAVENOUS | Status: AC
  Administered 2021-01-21: 333 mL via INTRAVENOUS

## 2021-01-20 MED ORDER — OXYTOCIN-SODIUM CHLORIDE 30-0.9 UT/500ML-% IV SOLN
2.5000 [IU]/h | INTRAVENOUS | Status: DC
Start: 2021-01-20 — End: 2021-01-21
  Filled 2021-01-20: qty 500

## 2021-01-20 MED ORDER — EPHEDRINE 5 MG/ML INJ: 10.0000 mg | INTRAVENOUS | Status: DC | PRN

## 2021-01-20 MED ORDER — LIDOCAINE HCL (PF) 1 % IJ SOLN: 30.0000 mL | INTRAMUSCULAR | Status: DC | PRN

## 2021-01-20 MED ORDER — EPHEDRINE 5 MG/ML INJ
10.0000 mg | INTRAVENOUS | Status: DC | PRN
Start: 2021-01-20 — End: 2021-01-21

## 2021-01-20 MED ORDER — OXYTOCIN 10 UNIT/ML IJ SOLN: 10.0000 [IU] | Freq: Once | INTRAMUSCULAR | Status: DC | PRN

## 2021-01-20 MED ORDER — TERBUTALINE SULFATE 1 MG/ML IJ SOLN: 0.2500 mg | Freq: Once | INTRAMUSCULAR | Status: DC | PRN

## 2021-01-20 MED ORDER — PHENYLEPHRINE HCL (PRESSORS) 10 MG/ML IV SOLN
INTRAVENOUS | Status: DC | PRN
Start: 2021-01-20 — End: 2021-01-21
  Administered 2021-01-20: 40 ug via INTRAVENOUS

## 2021-01-20 MED ORDER — SOD CITRATE-CITRIC ACID 500-334 MG/5ML PO SOLN: 30.0000 mL | ORAL | Status: DC | PRN

## 2021-01-20 MED ORDER — ACETAMINOPHEN 325 MG PO TABS: 650.0000 mg | ORAL_TABLET | ORAL | Status: DC | PRN

## 2021-01-20 MED ORDER — FENTANYL CITRATE (PF) 100 MCG/2ML IJ SOLN
100.0000 ug | INTRAMUSCULAR | Status: DC | PRN
  Administered 2021-01-20: 100 ug via INTRAVENOUS
  Filled 2021-01-20: qty 2

## 2021-01-20 MED ORDER — ONDANSETRON 4 MG PO TBDP: 4.0000 mg | ORAL_TABLET | Freq: Once | ORAL | Status: DC

## 2021-01-20 MED ORDER — FENTANYL-BUPIVACAINE-NACL 0.5-0.125-0.9 MG/250ML-% EP SOLN
12.0000 mL/h | EPIDURAL | Status: DC | PRN
  Administered 2021-01-20: 12 mL/h via EPIDURAL
  Filled 2021-01-20: qty 250

## 2021-01-20 MED ORDER — OXYCODONE-ACETAMINOPHEN 5-325 MG PO TABS: 1.0000 | ORAL_TABLET | ORAL | Status: DC | PRN

## 2021-01-20 MED ORDER — DIPHENHYDRAMINE HCL 50 MG/ML IJ SOLN: 12.5000 mg | INTRAMUSCULAR | Status: DC | PRN

## 2021-01-20 MED ORDER — LACTATED RINGERS IV SOLN: 500.0000 mL | INTRAVENOUS | Status: DC | PRN

## 2021-01-20 MED ORDER — PHENYLEPHRINE 40 MCG/ML (10ML) SYRINGE FOR IV PUSH (FOR BLOOD PRESSURE SUPPORT)
80.0000 ug | PREFILLED_SYRINGE | INTRAVENOUS | Status: DC | PRN
  Administered 2021-01-20: 80 ug via INTRAVENOUS
  Filled 2021-01-20: qty 10

## 2021-01-20 MED ORDER — LIDOCAINE HCL (PF) 1 % IJ SOLN
INTRAMUSCULAR | Status: DC | PRN
  Administered 2021-01-20: 10 mL via EPIDURAL

## 2021-01-20 MED ORDER — OXYTOCIN-SODIUM CHLORIDE 30-0.9 UT/500ML-% IV SOLN: 1.0000 m[IU]/min | INTRAVENOUS | Status: DC

## 2021-01-20 MED ORDER — PHENYLEPHRINE 40 MCG/ML (10ML) SYRINGE FOR IV PUSH (FOR BLOOD PRESSURE SUPPORT): 80.0000 ug | PREFILLED_SYRINGE | INTRAVENOUS | Status: DC | PRN

## 2021-01-20 NOTE — Progress Notes (Addendum)
Labor Progress Note Amanda Rhodes is a 30 y.o. G2P0010 at [redacted]w[redacted]d presented for latent labor S: Patient is resting comfortably.  O:  BP 99/66   Pulse 94   Temp 97.8 F (36.6 C) (Axillary)   Resp 17   Ht 5\' 5"  (1.651 m)   Wt 96.2 kg   LMP 04/16/2020 (Approximate)   SpO2 97%   BMI 35.28 kg/m  EFM: baseline 150 BPM/+accels/-decels/mod variability Toco: Contractions q2-53min   CVE: Dilation: Lip/rim Effacement (%): 100 Cervical Position: Middle Station: 0 Presentation: Vertex Exam by:: [email or web address], RN   A&P: 30 y.o. G2P0010 [redacted]w[redacted]d latent labor #Labor: Progressing well. Will consider AROM after pt 's mother and FOB gets to bedside per pt request.  #Pain: Epidural  #FWB: cat 1 #GBS negative   [redacted]w[redacted]d, MD [facility] 9:15 PM

## 2021-01-20 NOTE — MAU Provider Note (Signed)
Event Date/Time  First Provider Initiated Contact with Patient 01/20/21 1435     S: Ms. Amanda Rhodes is a 30 y.o. G2P0010 at [redacted]w[redacted]d  who presents to MAU today complaining of leaking of fluid since Sunday 01/18/2021. She denies vaginal bleeding. She endorses contractions. She reports normal fetal movement.    O: BP 128/81 (BP Location: Right Arm)   Pulse 94   Temp 98.6 F (37 C) (Oral)   Resp 15   LMP 04/16/2020 (Approximate)  GENERAL: Well-developed, well-nourished female in no acute distress.  HEAD: Normocephalic, atraumatic.  CHEST: Normal effort of breathing, regular heart rate ABDOMEN: Soft, nontender, gravid PELVIC: Normal external female genitalia. Vagina is pink and rugated. Cervix with normal contour, no lesions. Normal discharge.  Negative pooling.   Cervical exam:  Dilation: 2.5 Effacement (%): 60 Cervical Position: Posterior Station: Ballotable Presentation: Undeterminable Exam by:: E.Edwards,RN   Fetal Monitoring: Baseline: 145 Variability: Mod Accelerations: 15 x 15 Decelerations: None Contractions: Irregular  Results for orders placed or performed during the hospital encounter of 01/20/21 (from the past 24 hour(s))  Fern Test     Status: Normal   Collection Time: 01/20/21  2:16 PM  Result Value Ref Range   POCT Fern Test Negative = intact amniotic membranes   Wet prep, genital     Status: Abnormal   Collection Time: 01/20/21  2:55 PM  Result Value Ref Range   Yeast Wet Prep HPF POC NONE SEEN NONE SEEN   Trich, Wet Prep NONE SEEN NONE SEEN   Clue Cells Wet Prep HPF POC NONE SEEN NONE SEEN   WBC, Wet Prep HPF POC FEW (A) NONE SEEN   Sperm NONE SEEN      A: SIUP at [redacted]w[redacted]d  Negative pooling Negative fern  P: Intact amniotic sac, RN to coordinate ongoing labor eval with L&D team  Clayton Bibles, CNM 01/20/2021 4:05 PM

## 2021-01-20 NOTE — Anesthesia Procedure Notes (Signed)
Epidural Patient location during procedure: OB Start time: 01/20/2021 7:13 PM End time: 01/20/2021 7:17 PM  Staffing Anesthesiologist: Leilani Able, MD Performed: anesthesiologist   Preanesthetic Checklist Completed: patient identified, IV checked, site marked, risks and benefits discussed, surgical consent, monitors and equipment checked, pre-op evaluation and timeout performed  Epidural Patient position: sitting Prep: DuraPrep and site prepped and draped Patient monitoring: continuous pulse ox and blood pressure Approach: midline Location: L3-L4 Injection technique: LOR air  Needle:  Needle type: Tuohy  Needle gauge: 17 G Needle length: 9 cm and 9 Needle insertion depth: 7 cm Catheter type: closed end flexible Catheter size: 19 Gauge Catheter at skin depth: 13 cm Test dose: negative and Other  Assessment Events: blood not aspirated, injection not painful, no injection resistance, no paresthesia and negative IV test  Additional Notes Reason for block:procedure for pain

## 2021-01-20 NOTE — MAU Note (Signed)
.  Amanda Rhodes is a 30 y.o. at [redacted]w[redacted]d here in MAU reporting: ctx since last night that she rates 10/10. Also reports leaking clear fluid since Sunday. Endorses good fetal movement.   Pain score: 10 Vitals:   01/20/21 1331  BP: 128/81  Pulse: 94  Resp: 15  Temp: 98.6 F (37 C)     FHT:145

## 2021-01-20 NOTE — Telephone Encounter (Signed)
Preadmission screen  

## 2021-01-20 NOTE — Anesthesia Preprocedure Evaluation (Signed)
Anesthesia Evaluation  Patient identified by MRN, date of birth, ID band Patient awake    Reviewed: Allergy & Precautions, H&P , Patient's Chart, lab work & pertinent test results  Airway Mallampati: II       Dental no notable dental hx.    Pulmonary    Pulmonary exam normal        Cardiovascular negative cardio ROS Normal cardiovascular exam     Neuro/Psych negative neurological ROS  negative psych ROS   GI/Hepatic negative GI ROS, Neg liver ROS,   Endo/Other  negative endocrine ROS  Renal/GU negative Renal ROS     Musculoskeletal negative musculoskeletal ROS (+)   Abdominal (+) + obese,   Peds  Hematology  (+) Blood dyscrasia, anemia ,   Anesthesia Other Findings   Reproductive/Obstetrics (+) Pregnancy                             Anesthesia Physical Anesthesia Plan  ASA: 2  Anesthesia Plan: Epidural   Post-op Pain Management:    Induction:   PONV Risk Score and Plan:   Airway Management Planned:   Additional Equipment:   Intra-op Plan:   Post-operative Plan:   Informed Consent: I have reviewed the patients History and Physical, chart, labs and discussed the procedure including the risks, benefits and alternatives for the proposed anesthesia with the patient or authorized representative who has indicated his/her understanding and acceptance.       Plan Discussed with:   Anesthesia Plan Comments:         Anesthesia Quick Evaluation

## 2021-01-20 NOTE — H&P (Signed)
Amanda Rhodes is a 30 y.o. G2P0010 female at [redacted]w[redacted]d by LMP c/w 13.5wk u/s presenting with latent labor.   Reports active fetal movement, contractions: reg, vaginal bleeding: scant staining, membranes: intact.  [facility]iated prenatal care at [facility] at 14 wks.   Most recent u/s: 23w 2d, EFW 74%, nl fluid, posterior placenta.   This pregnancy complicated by: Previous anemia (10.9 at 27wk; 11.5 on admit)  Prenatal History/Complications:  SAB 07/2019  Past Medical History: Past Medical History:  Diagnosis Date   Asthma     Past Surgical History: Past Surgical History:  Procedure Laterality Date   NO PAST SURGERIES      Obstetrical History: OB History     Gravida  2   Para      Term      Preterm      AB  1   Living         SAB  1   IAB      Ectopic      Multiple      Live Births              Social History: Social History   Socioeconomic History   Marital status: Significant Other    Spouse name: Not on file   Number of children: Not on file   Years of education: Not on file   Highest education level: Some college, no degree  Occupational History   Not on file  Tobacco Use   Smoking status: Never   Smokeless tobacco: Never  Vaping Use   Vaping Use: Never used  Substance and Sexual Activity   Alcohol use: Not Currently    Alcohol/week: 10.0 standard drinks    Types: 10 Cans of beer per week    Comment: Socially   Drug use: Not Currently    Types: Marijuana    Comment: Last smoked early January 2021   Sexual activity: Yes    Birth control/protection: None  Other Topics Concern   Not on file  Social History Narrative   Not on file   Social Determinants of Health   Financial Resource Strain: Not on file  Food Insecurity: Not on file  Transportation Needs: Not on file  Physical Activity: Not on file  Stress: Not on file  Social Connections: Not on file    Family History: Family History  Problem Relation Age of Onset    Healthy Mother    Healthy Father     Allergies: Allergies  Allergen Reactions   Peanut-Containing Drug Products     Medications Prior to Admission  Medication Sig Dispense Refill Last Dose   ascorbic acid (VITAMIN C) 500 MG tablet Take 1 tablet (500 mg total) by mouth every other day. Take with iron pill 30 tablet 3 01/19/2021   ferrous sulfate 325 (65 FE) MG EC tablet Take 1 tablet (325 mg total) by mouth every other day. Take with Vitamin C 60 tablet 2 01/19/2021   Prenatal Vit-Fe Fumarate-FA (MULTIVITAMIN-PRENATAL) 27-0.8 MG TABS tablet Take 1 tablet by mouth daily at 12 noon.   Past Week    Review of Systems  Pertinent pos/neg as indicated in HPI  Blood pressure 128/81, pulse 94, temperature 98.6 F (37 C), temperature source Oral, resp. rate 15, last menstrual period 04/16/2020, unknown if currently breastfeeding. General appearance: alert, cooperative, and mild distress Lungs: clear to auscultation bilaterally Heart: regular rate and rhythm Abdomen: gravid, soft, non-tender, EFW by Leopold's approximately 7lbs Extremities: tr edema DTR's nl  Fetal  monitoring: FHR: 130-140 bpm, variability: moderate,  Accelerations: Present,  decelerations:  Absent Uterine activity: Frequency: Every 3-4 minutes Dilation: 4.5 Effacement (%): 90 Station: -2 Exam by:: E.Edwards, RN Presentation: cephalic   Prenatal labs: ABO, Rh: O/Positive/-- (01/11 0931) Antibody: Negative (01/11 0931) Rubella: 17.00 (01/11 0931) RPR: Non Reactive (05/04 0842)  HBsAg: Negative (01/11 0931)  HIV: Non Reactive (05/04 0842)  GBS: Negative/-- (07/06 0937)  2hr GTT: 83/114/113  Prenatal Transfer Tool  Maternal Diabetes: No Genetic Screening: Normal Maternal Ultrasounds/Referrals: Normal Fetal Ultrasounds or other Referrals:  None Maternal Substance Abuse:  No Significant Maternal Medications:  None Significant Maternal Lab Results: Group B Strep negative  Results for orders placed or performed  during the hospital encounter of 01/20/21 (from the past 24 hour(s))  Fern Test   Collection Time: 01/20/21  2:16 PM  Result Value Ref Range   POCT Fern Test Negative = intact amniotic membranes   Wet prep, genital   Collection Time: 01/20/21  2:55 PM  Result Value Ref Range   Yeast Wet Prep HPF POC NONE SEEN NONE SEEN   Trich, Wet Prep NONE SEEN NONE SEEN   Clue Cells Wet Prep HPF POC NONE SEEN NONE SEEN   WBC, Wet Prep HPF POC FEW (A) NONE SEEN   Sperm NONE SEEN      Assessment:  [redacted]w[redacted]d SIUP  G2P0010  Early active labor  Cat 1 FHR  GBS Negative/-- (07/06 4580)  Plan:  Admit to L&D  IV pain meds/epidural prn active labor  Expectant management  Anticipate vag del   Plans to breastfeed  Contraception: undecided    Arabella Merles CNM 01/20/2021, 5:42 PM

## 2021-01-21 ENCOUNTER — Encounter (HOSPITAL_COMMUNITY): Payer: Self-pay | Admitting: Obstetrics and Gynecology

## 2021-01-21 DIAGNOSIS — Z3A39 39 weeks gestation of pregnancy: Secondary | ICD-10-CM

## 2021-01-21 LAB — RPR: RPR Ser Ql: NONREACTIVE

## 2021-01-21 MED ORDER — SIMETHICONE 80 MG PO CHEW: 80.0000 mg | CHEWABLE_TABLET | ORAL | Status: DC | PRN

## 2021-01-21 MED ORDER — WITCH HAZEL-GLYCERIN EX PADS: 1.0000 "application " | MEDICATED_PAD | CUTANEOUS | Status: DC | PRN

## 2021-01-21 MED ORDER — PRENATAL MULTIVITAMIN CH
1.0000 | ORAL_TABLET | Freq: Every day | ORAL | Status: DC
  Administered 2021-01-21: 1 via ORAL
  Filled 2021-01-21: qty 1

## 2021-01-21 MED ORDER — DIPHENHYDRAMINE HCL 25 MG PO CAPS: 25.0000 mg | ORAL_CAPSULE | Freq: Four times a day (QID) | ORAL | Status: DC | PRN

## 2021-01-21 MED ORDER — IBUPROFEN 600 MG PO TABS
600.0000 mg | ORAL_TABLET | Freq: Four times a day (QID) | ORAL | Status: DC
  Administered 2021-01-21 – 2021-01-22 (×5): 600 mg via ORAL
  Filled 2021-01-21 (×5): qty 1

## 2021-01-21 MED ORDER — DIBUCAINE (PERIANAL) 1 % EX OINT: 1.0000 "application " | TOPICAL_OINTMENT | CUTANEOUS | Status: DC | PRN

## 2021-01-21 MED ORDER — SENNOSIDES-DOCUSATE SODIUM 8.6-50 MG PO TABS: 2.0000 | ORAL_TABLET | Freq: Every day | ORAL | Status: DC

## 2021-01-21 MED ORDER — ACETAMINOPHEN 325 MG PO TABS: 650.0000 mg | ORAL_TABLET | ORAL | Status: DC | PRN

## 2021-01-21 MED ORDER — TRANEXAMIC ACID-NACL 1000-0.7 MG/100ML-% IV SOLN: 1000.0000 mg | Freq: Once | INTRAVENOUS | Status: AC

## 2021-01-21 MED ORDER — ONDANSETRON HCL 4 MG PO TABS: 4.0000 mg | ORAL_TABLET | ORAL | Status: DC | PRN

## 2021-01-21 MED ORDER — COCONUT OIL OIL: 1.0000 "application " | TOPICAL_OIL | Status: DC | PRN

## 2021-01-21 MED ORDER — ONDANSETRON HCL 4 MG/2ML IJ SOLN: 4.0000 mg | INTRAMUSCULAR | Status: DC | PRN

## 2021-01-21 MED ORDER — BENZOCAINE-MENTHOL 20-0.5 % EX AERO
1.0000 "application " | INHALATION_SPRAY | CUTANEOUS | Status: DC | PRN
  Administered 2021-01-22: 1 via TOPICAL
  Filled 2021-01-21: qty 56

## 2021-01-21 MED ORDER — TRANEXAMIC ACID-NACL 1000-0.7 MG/100ML-% IV SOLN
INTRAVENOUS | Status: AC
  Administered 2021-01-21: 1000 mg via INTRAVENOUS
  Filled 2021-01-21: qty 100

## 2021-01-21 MED ORDER — TETANUS-DIPHTH-ACELL PERTUSSIS 5-2.5-18.5 LF-MCG/0.5 IM SUSY: 0.5000 mL | PREFILLED_SYRINGE | Freq: Once | INTRAMUSCULAR | Status: DC

## 2021-01-21 NOTE — Plan of Care (Signed)
  Problem: Education: Goal: Knowledge of General Education information will improve Description: Including pain rating scale, medication(s)/side effects and non-pharmacologic comfort measures Outcome: Completed/Met

## 2021-01-21 NOTE — Anesthesia Postprocedure Evaluation (Signed)
Anesthesia Post Note  Patient: Lamia Spagnoli  Procedure(s) Performed: AN AD HOC LABOR EPIDURAL     Patient location during evaluation: Mother Baby Anesthesia Type: Epidural Level of consciousness: awake and alert Pain management: pain level controlled Vital Signs Assessment: post-procedure vital signs reviewed and stable Respiratory status: spontaneous breathing, nonlabored ventilation and respiratory function stable Cardiovascular status: stable Postop Assessment: no headache, no backache and epidural receding Anesthetic complications: no   No notable events documented.  Last Vitals:  Vitals:   01/21/21 0300 01/21/21 0614  BP: 101/66 105/63  Pulse: 91 95  Resp: 17 17  Temp: 36.9 C 36.8 C  SpO2: 99% 99%    Last Pain:  Vitals:   01/21/21 0745  TempSrc:   PainSc: 2    Pain Goal:                   Valia Wingard

## 2021-01-21 NOTE — Lactation Note (Signed)
This note was copied from a baby's chart. Lactation Consultation Note FOB holding baby STS. Mom stated baby has latched well and BF well she is excited about it. Will f/u w/mom and baby on MBU.  Patient Name: Amanda RhodesH Date: 01/21/2021   Age:30 hours  Maternal Data    Feeding    LATCH Score Latch: Repeated attempts needed to sustain latch, nipple held in mouth throughout feeding, stimulation needed to elicit sucking reflex.  Audible Swallowing: Spontaneous and intermittent  Type of Nipple: Everted at rest and after stimulation  Comfort (Breast/Nipple): Soft / non-tender  Hold (Positioning): Assistance needed to correctly position infant at breast and maintain latch.  LATCH Score: 8   Lactation Tools Discussed/Used    Interventions    Discharge    Consult Status      [name] 01/21/2021, 1:29 AM

## 2021-01-21 NOTE — Lactation Note (Signed)
This note was copied from a baby's chart. Lactation Consultation Note Mom awake holding baby in her arms. Mom stated she couldn't sleep. Asked mom if she would like for LC to assist in latching baby. Mom stated not at this time. She didn't want to wake her up.  Mom encouraged to feed baby 8-12 times/24 hours and with feeding cues.   Discussed importance of STS and I&O. Newborn feeding habits reviewed. Lactation brochure given.  Patient Name: Girl Devaney Segers MHWKG'S Date: 01/21/2021 Reason for consult: Initial assessment;Primapara;Term Age:58 hours  Maternal Data    Feeding    LATCH Score                    Lactation Tools Discussed/Used    Interventions Interventions: Breast feeding basics reviewed  Discharge WIC Program: No  Consult Status Consult Status: Follow-up Date: 01/21/21 Follow-up type: In-patient    Jaicob Dia, Diamond Nickel 01/21/2021, 5:02 AM

## 2021-01-21 NOTE — Discharge Summary (Addendum)
Postpartum Discharge Summary     Patient Name: Amanda Rhodes DOB: 1990-12-06 MRN: [id number]  Date of admission: 01/20/2021 Delivery date:01/21/2021  Delivering provider: CONSTANT, PEGGY  Date of discharge: 01/22/2021  Admitting diagnosis: Indication for care in labor or delivery [O75.9] Intrauterine pregnancy: [redacted]w[redacted]d    Secondary diagnosis:  Active Problems:   Indication for care in labor or delivery  Additional problems: none    Discharge diagnosis: Term Pregnancy Delivered                                              Post partum procedures: NA Augmentation: N/A Complications: None  Hospital course: Onset of Labor With Vaginal Delivery      30y.o. yo G2P1011 at 446w0das admitted in Active Labor on 01/20/2021. Patient had an uncomplicated labor course as follows:  Membrane Rupture Time/Date: 9:41 PM ,01/20/2021   Delivery Method:Vaginal, Spontaneous  Episiotomy: None  Lacerations:  2nd degree  Patient had an uncomplicated postpartum course.  She is ambulating, tolerating a regular diet, passing flatus, and urinating well. Patient is discharged home in stable condition on 01/22/21.  Newborn Data: Birth date:01/21/2021  Birth time:12:08 AM  Gender:Female  Living status:Living  Apgars:9 ,9  Weight:3399 g   Magnesium Sulfate received: No BMZ received: No Rhophylac:N/A MMR:N/A T-DaP: declined prenatally and PP Flu: N/A Transfusion:No  Physical exam  Vitals:   01/21/21 0938 01/21/21 1339 01/21/21 2115 01/22/21 0533  BP: 103/71 112/68 112/68 (!) 98/59  Pulse: 90 91 80 75  Resp: '18 16 18 18  ' Temp: 98.9 F (37.2 C) 98.4 F (36.9 C) 98.3 F (36.8 C) 98.2 F (36.8 C)  TempSrc: Oral Oral Oral Oral  SpO2:  100% 100% 97%  Weight:      Height:       General: alert, cooperative, and no distress Lochia: appropriate Uterine Fundus: firm Incision: N/A DVT Evaluation: No evidence of DVT seen on physical exam. Labs: Lab Results  Component Value Date   WBC 16.3 (H)  01/20/2021   HGB 11.5 (L) 01/20/2021   HCT 33.8 (L) 01/20/2021   MCV 88.5 01/20/2021   PLT 265 01/20/2021   CMP Latest Ref Rng & Units 09/27/2017  Glucose 65 - 99 mg/dL 85  BUN 6 - 20 mg/dL 14  Creatinine 0.44 - 1.00 mg/dL 1.01(H)  Sodium 135 - 145 mmol/L 133(L)  Potassium 3.5 - 5.1 mmol/L 4.1  Chloride 101 - 111 mmol/L 101  CO2 22 - 32 mmol/L 15(L)  Calcium 8.9 - 10.3 mg/dL 9.7  Total Protein 6.5 - 8.1 g/dL 8.7(H)  Total Bilirubin 0.3 - 1.2 mg/dL 0.9  Alkaline Phos 38 - 126 U/L 69  AST 15 - 41 U/L 17  ALT 14 - 54 U/L 9(L)   Edinburgh Score: Edinburgh Postnatal Depression Scale Screening Tool 01/21/2021  I have been able to laugh and see the funny side of things. 0  I have looked forward with enjoyment to things. 0  I have blamed myself unnecessarily when things went wrong. 1  I have been anxious or worried for no good reason. 1  I have felt scared or panicky for no good reason. 1  Things have been getting on top of me. 0  I have been so unhappy that I have had difficulty sleeping. 0  I have felt sad or miserable. 1  I have  been so unhappy that I have been crying. 0  The thought of harming myself has occurred to me. 0  Edinburgh Postnatal Depression Scale Total 4     After visit meds:  Allergies as of 01/22/2021       Reactions   Peanut-containing Drug Products         Medication List     STOP taking these medications    ascorbic acid 500 MG tablet Commonly known as: VITAMIN C       TAKE these medications    ferrous sulfate 325 (65 FE) MG EC tablet Take 1 tablet (325 mg total) by mouth every other day. Take with Vitamin C   ibuprofen 600 MG tablet Commonly known as: ADVIL Take 1 tablet (600 mg total) by mouth every 6 (six) hours.   multivitamin-prenatal 27-0.8 MG Tabs tablet Take 1 tablet by mouth daily at 12 noon.         Discharge home in stable condition Infant Feeding: Bottle and Breast Infant Disposition:home with mother Discharge  instruction: per After Visit Summary and Postpartum booklet. Activity: Advance as tolerated. Pelvic rest for 6 weeks.  Diet: routine diet Future Appointments: Future Appointments  Date Time Provider [place]  03/04/2021  8:55 AM Laury Deep, CNM [facility] None   Follow up Visit:   Message sent to [facility] by Maryagnes Amos, CNM Please schedule this patient for a In person postpartum visit in 6 weeks with the following provider: Any provider. Additional Postpartum F/U: NA   Low risk pregnancy complicated by:  NA Delivery mode:  Vaginal, Spontaneous  Anticipated Birth Control:  Unsure   01/22/2021 Waldon Merl, MD  CNM attestation I have seen and examined this patient and agree with above documentation in the resident's note.   Amanda Rhodes is a 30 y.o. G2P1011 s/p vag del.   Pain is well controlled.  Plan for birth control is  unsure .  Method of Feeding: breast and bottle  PE:  BP (!) 98/59 (BP Location: Right Arm)   Pulse 75   Temp 98.2 F (36.8 C) (Oral)   Resp 18   Ht '5\' 5"'  (1.651 m)   Wt 96.2 kg   LMP 04/16/2020 (Approximate)   SpO2 97%   Breastfeeding Unknown   BMI 35.28 kg/m  Fundus firm  Recent Labs    01/20/21 1744  HGB 11.5*  HCT 33.8*     Plan: discharge today - postpartum care discussed - f/u clinic in 4-6 weeks for postpartum visit   Myrtis Ser, CNM 10:00 PM 01/22/2021

## 2021-01-22 ENCOUNTER — Encounter: Payer: 59 | Admitting: Obstetrics and Gynecology

## 2021-01-22 MED ORDER — IBUPROFEN 600 MG PO TABS: 600.0000 mg | ORAL_TABLET | Freq: Four times a day (QID) | ORAL | 0 refills | Status: DC

## 2021-01-22 NOTE — Social Work (Signed)
CSW received consult for hx of marijuana use.  Referral was screened out due to the following:  ~MOB had no documented substance use after initial prenatal visit/+UPT.  ~MOB had no positive drug screens after initial prenatal visit/+UPT.  ~Baby's UDS is negative/discontinued.   Per chart review, it is noted that MOB last THC use was January 2021. CSW discussed with nursing staff, no concerns identified.   Please consult CSW if current concerns arise or by MOB's request.  CSW will monitor CDS results and make report to Child Protective Services if warranted.   Vivi Barrack, MSW, LCSW [facility]  Clinical Social Worker  [phone] 01/22/2021  9:41 AM

## 2021-01-22 NOTE — Lactation Note (Signed)
This note was copied from a baby's chart. Lactation Consultation Note  Patient Name: Amanda Rhodes [id number] Date: 01/22/2021 Reason for consult: Follow-up assessment;Primapara;Term;Infant weight loss;Other (Comment) (5 % weight loss/ LC reviewed and updated the doc flow sheets per mom) Age:30 hours Per mom baby getting better at latching and hearing swallows.  Baby recently fed at 10 am for 15 mins.  Dad holding baby and she is asleep at present.  LC reviewed BF D/C teaching - see below and answered moms BF questions.  LC provided the [facility] brochure with BF resource numbers.   Maternal Data Does the patient have breastfeeding experience prior to this delivery?: No  Feeding Mother's Current Feeding Choice: Breast Milk  LATCH Score                    Lactation Tools Discussed/Used    Interventions Interventions: Breast feeding basics reviewed;Education  Discharge Discharge Education: Engorgement and breast care;Warning signs for feeding baby Pump: Personal;Manual;DEBP  Consult Status Consult Status: Complete Date: 01/22/21    Matilde Sprang [name] 01/22/2021, 11:03 AM

## 2021-01-28 ENCOUNTER — Inpatient Hospital Stay (HOSPITAL_COMMUNITY): Payer: 59

## 2021-01-28 ENCOUNTER — Inpatient Hospital Stay (HOSPITAL_COMMUNITY): Admission: AD | Admit: 2021-01-28 | Payer: 59 | Source: Home / Self Care | Admitting: Family Medicine

## 2021-02-04 ENCOUNTER — Telehealth (HOSPITAL_COMMUNITY): Payer: Self-pay

## 2021-02-04 NOTE — Telephone Encounter (Signed)
No answer. Left message to return nurse call.   Marcelino Duster [facility] 02/04/2021,1837

## 2021-03-04 ENCOUNTER — Encounter: Payer: Self-pay | Admitting: Obstetrics and Gynecology

## 2021-03-04 ENCOUNTER — Ambulatory Visit: Payer: 59 | Admitting: Obstetrics and Gynecology

## 2022-06-02 IMAGING — US US OB COMP LESS 14 WK
1 series · 15 of 28 positions shown · non-contrast
Comparison: None.

CLINICAL DATA: Confirm dating/viability

EXAM:
OBSTETRIC <14 WK ULTRASOUND
TECHNIQUE: Transabdominal ultrasound was performed for evaluation of the
gestation as well as the maternal uterus and adnexal regions.

[Series 1: us ob comp less 14 wk · 15 of 64 slices shown]
[im 1/64]
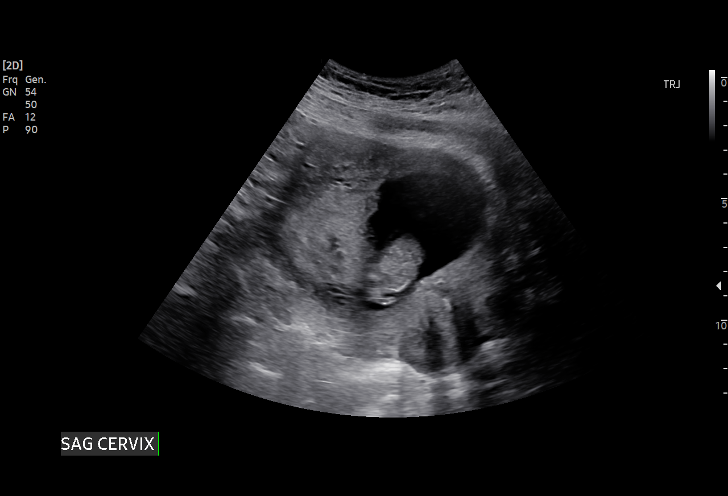
[im 5/64]
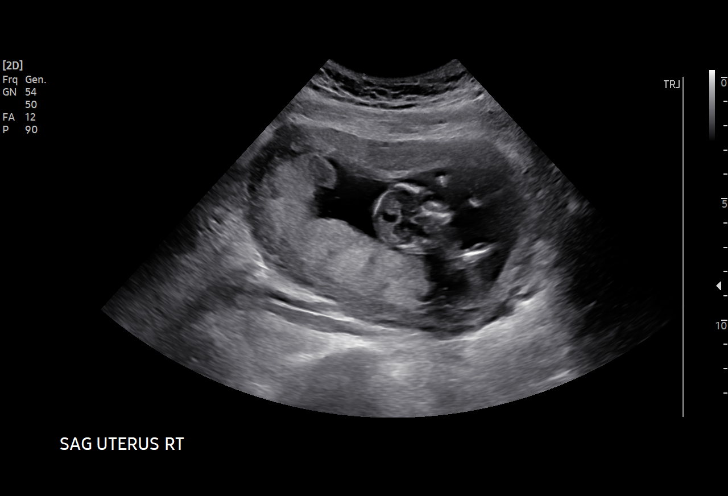
[im 10/64]
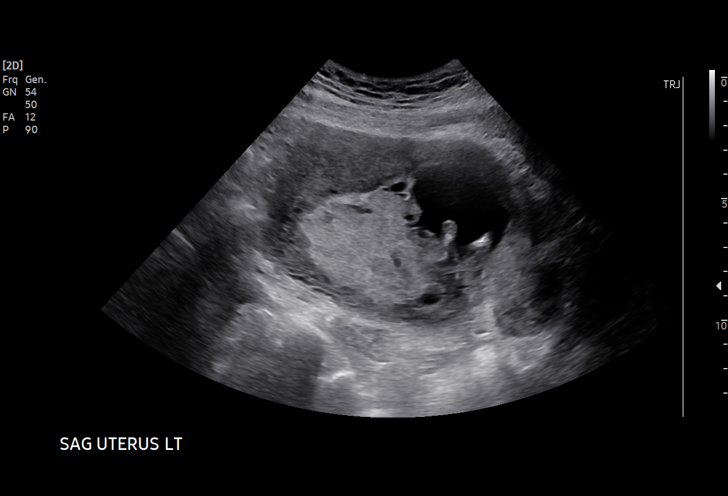
[im 15/64]
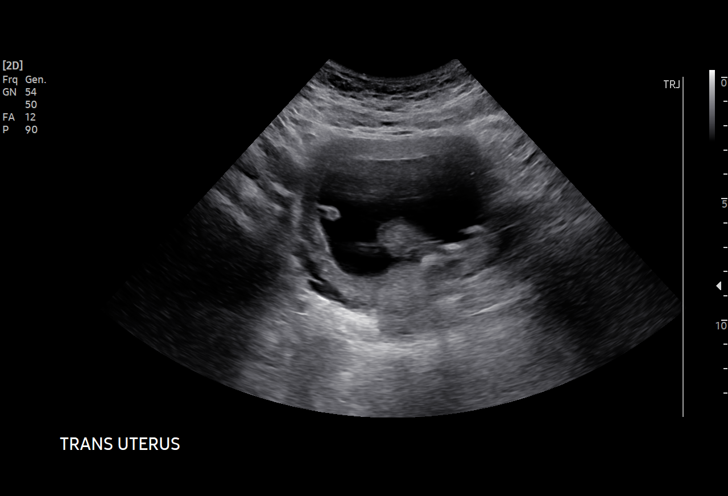
[im 19/64]
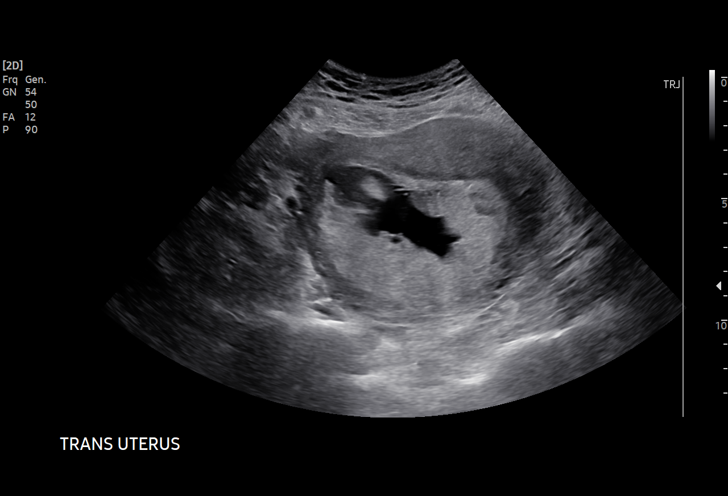
[im 24/64]
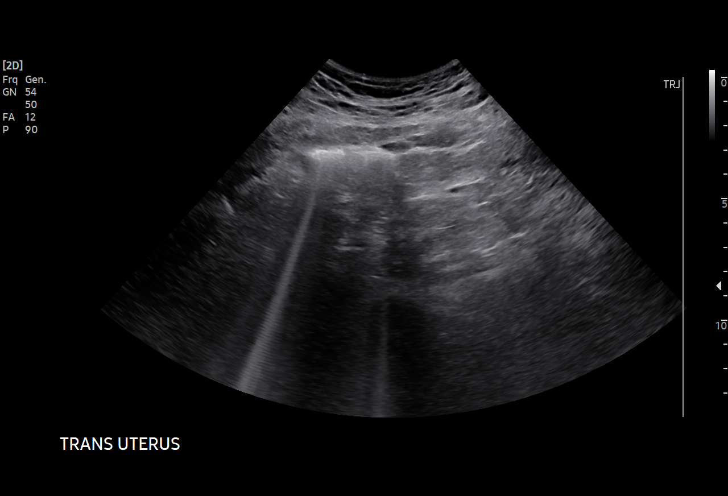
[im 29/64]
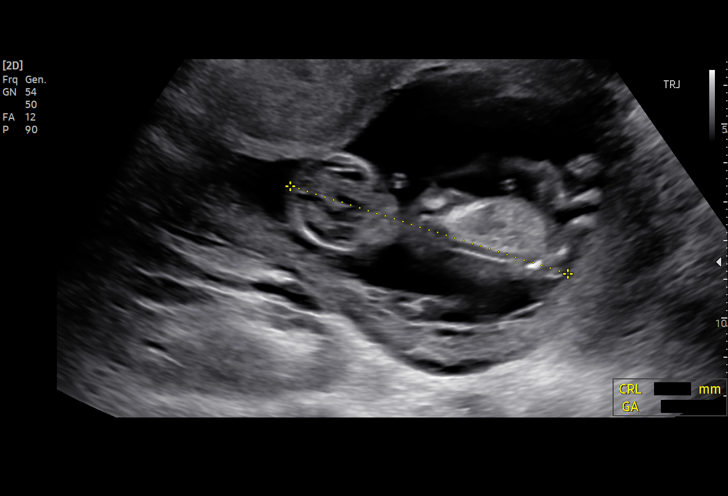
[im 33/64]
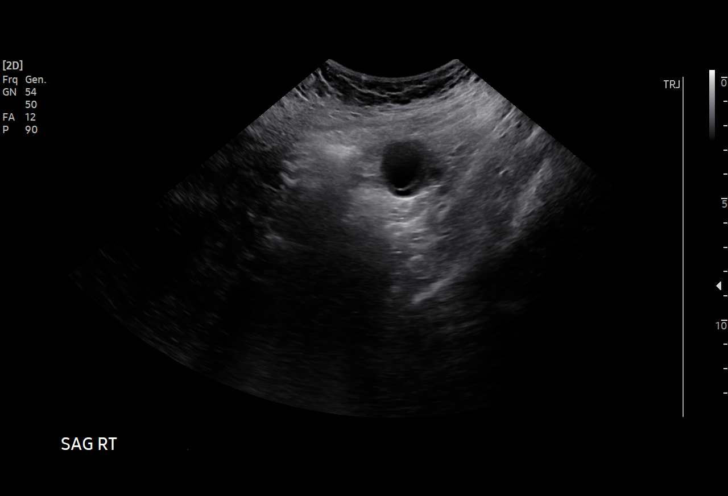
[im 36/64]
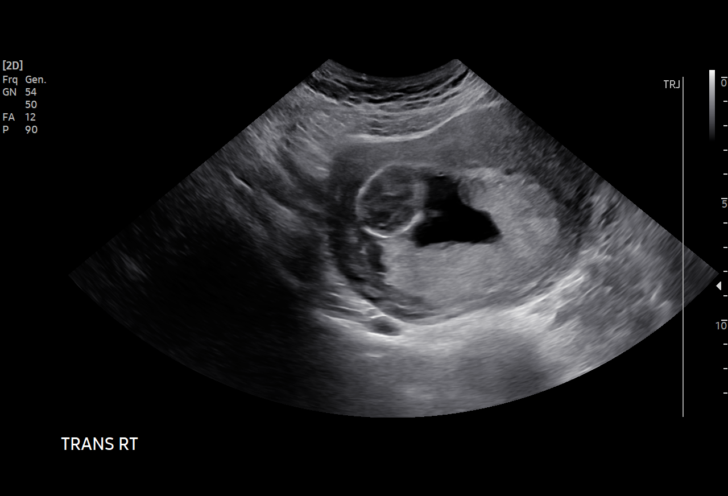
[im 40/64]
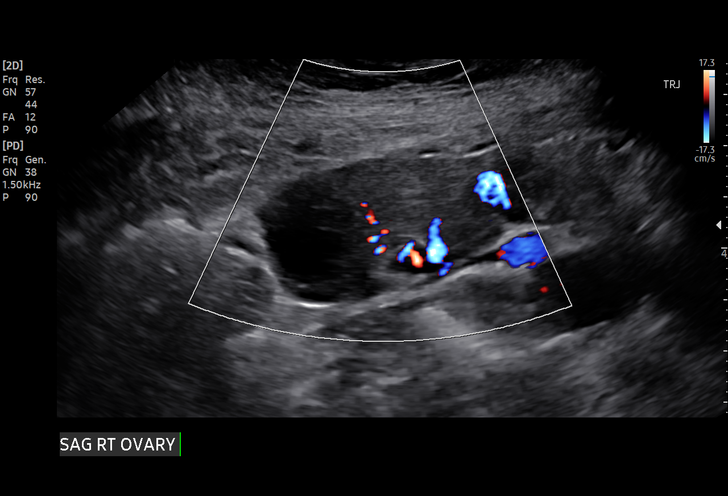
[im 45/64]
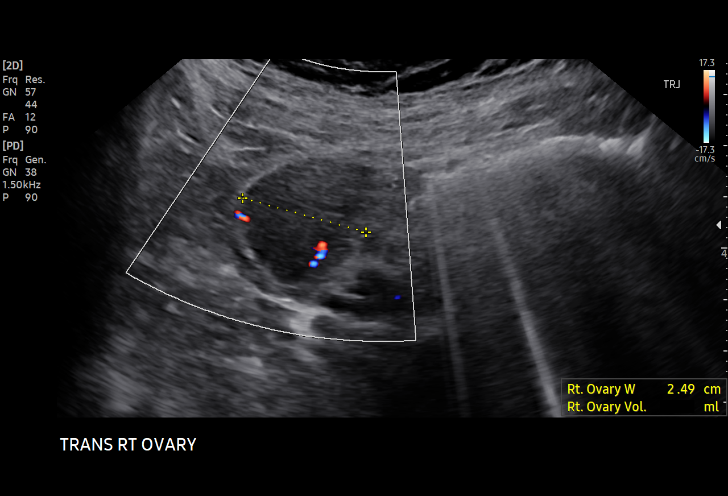
[im 50/64]
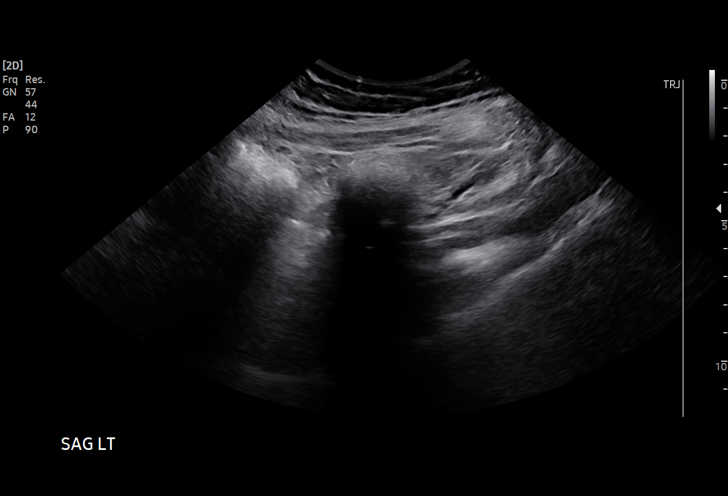
[im 54/64]
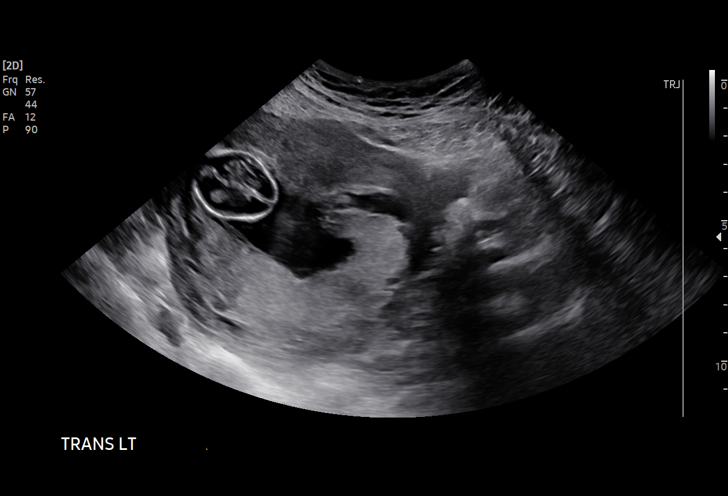
[im 59/64]
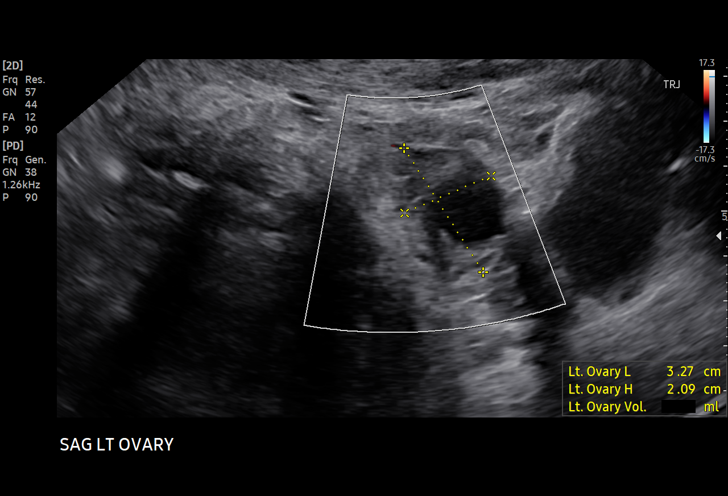
[im 64/64]
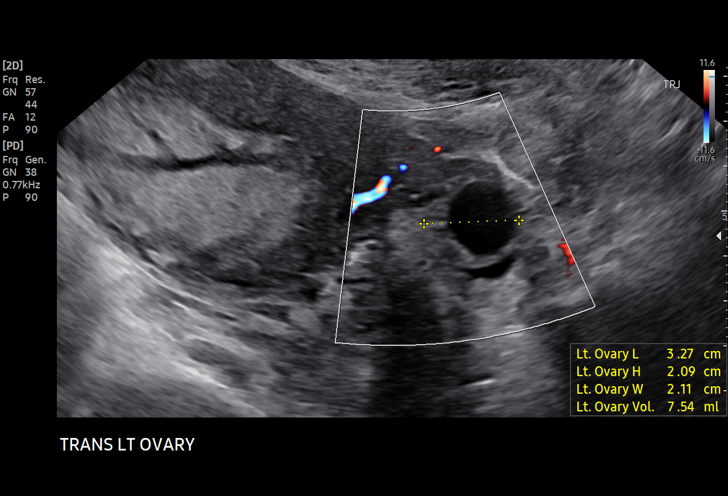

[15 of 28 positions shown; findings below may reference images not displayed]

FINDINGS: Intrauterine gestational sac: Single

Yolk sac:  Not Visualized.

Embryo:  Visualized.

Cardiac Activity: Visualized.

Heart Rate: 145 bpm

CRL:   76.4 mm   13 w 5 d                  US EDC: 01/17/2021

Subchorionic hemorrhage:  None visualized.

Maternal uterus/adnexae: Normal in appearance. No free fluid in the
pelvis.
IMPRESSION: Single viable intrauterine pregnancy with estimated gestational age
of 13 weeks 5 days.

## 2022-07-15 IMAGING — US US MFM OB COMP +14 WKS
1 series · 13 of 28 positions shown · non-contrast
Comparison: none

[Series 1: us mfm ob comp +14 wks · 73 acquisitions, 13 frames shown]
[im 3/73]
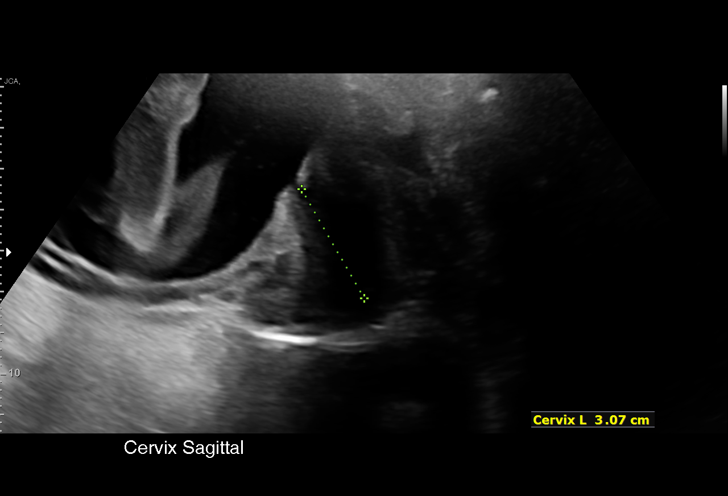
[im 9/73]
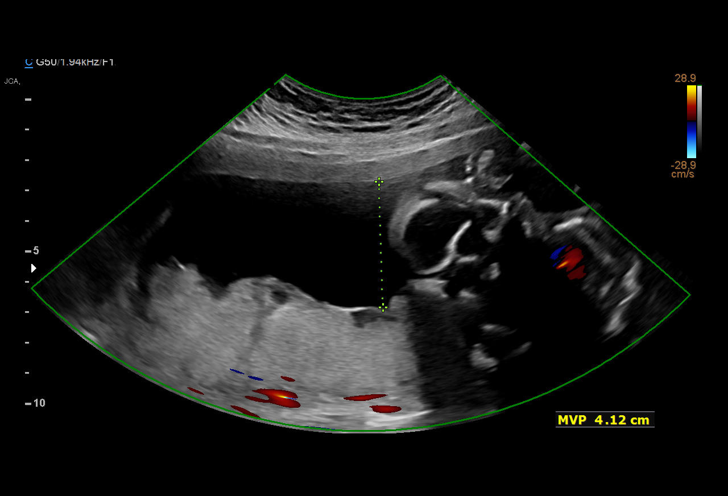
[im 14/73]
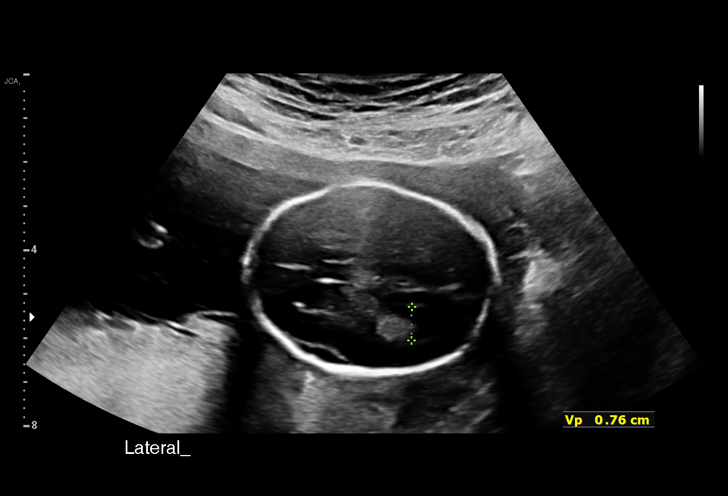
[im 19/73]
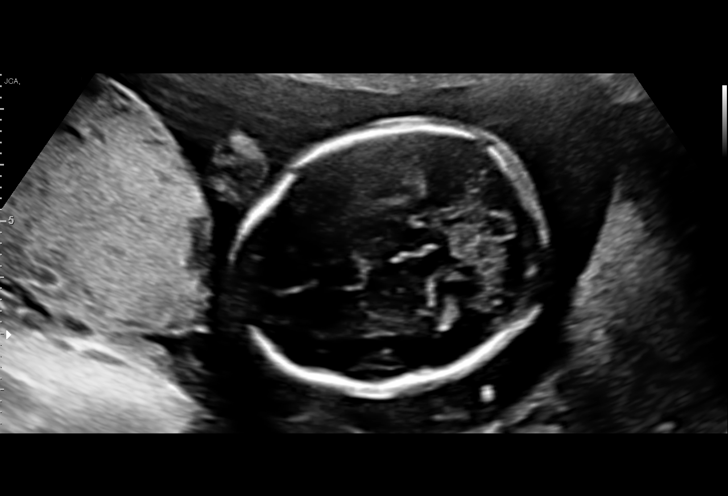
[im 25/73]
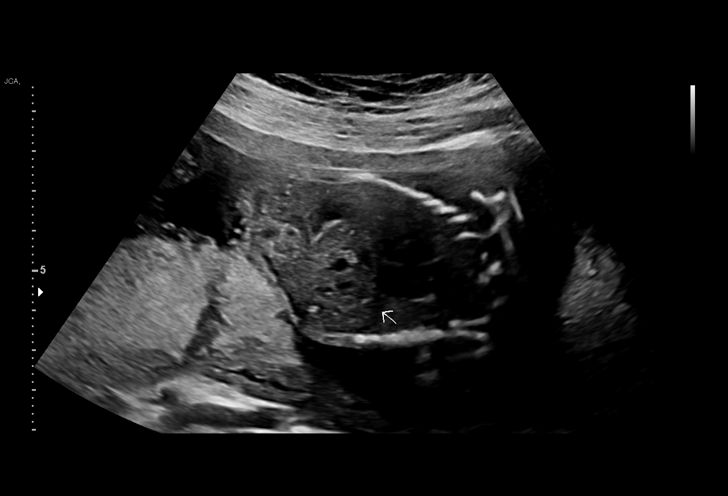
[im 30/73]
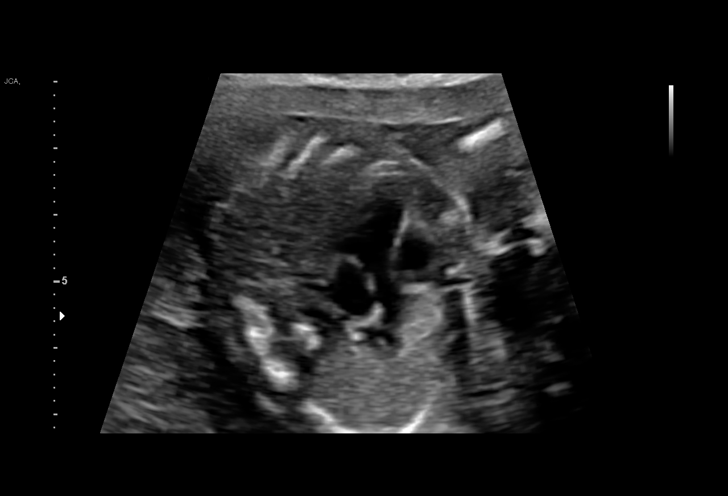
[im 38/73]
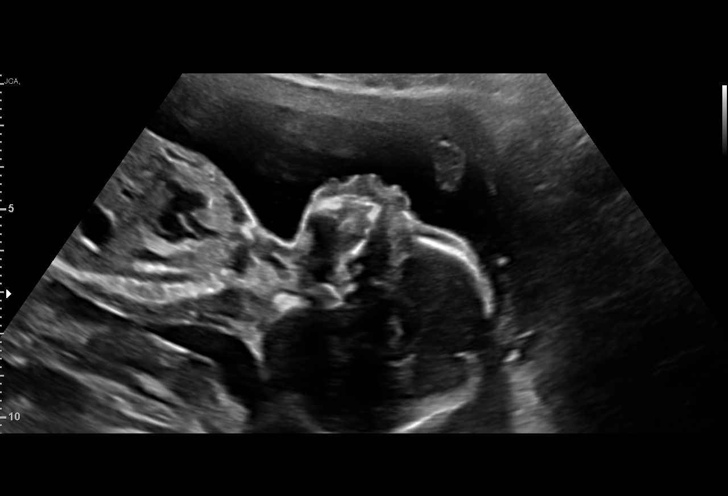
[im 43/73]
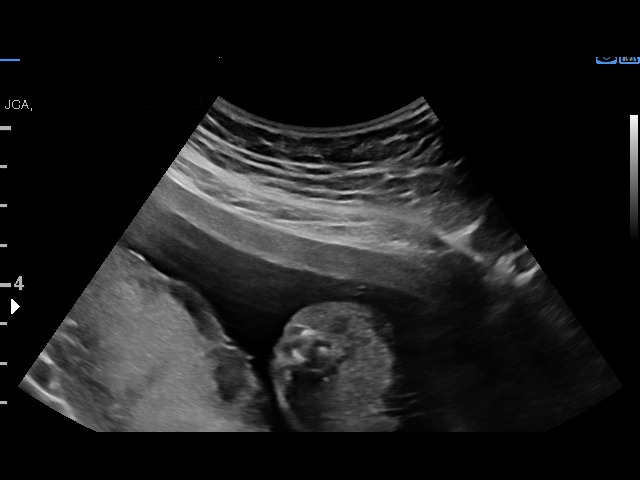
[im 49/73]
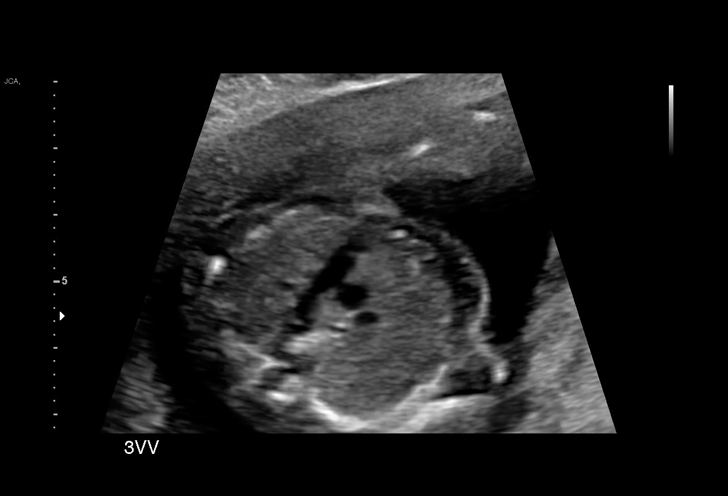
[im 54/73]
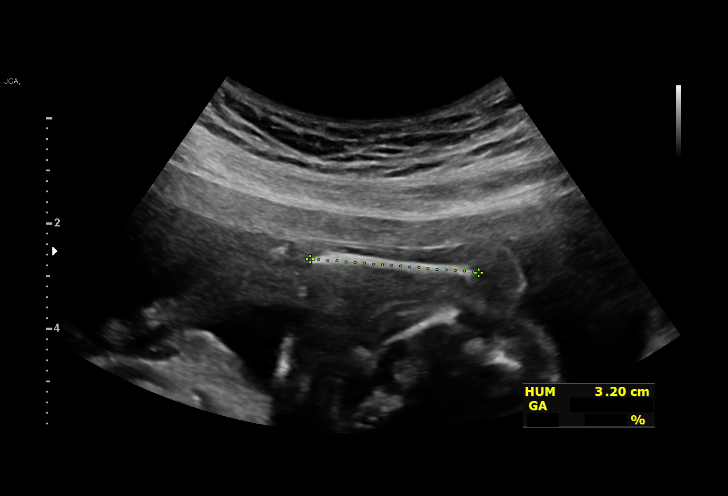
[im 59/73]
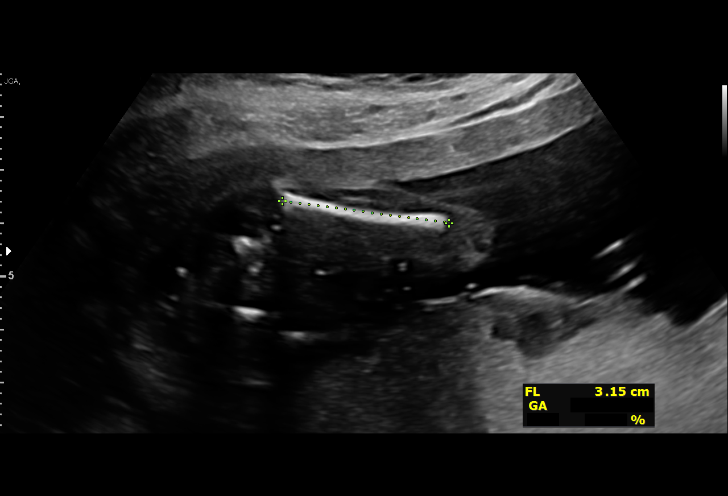
[im 65/73]
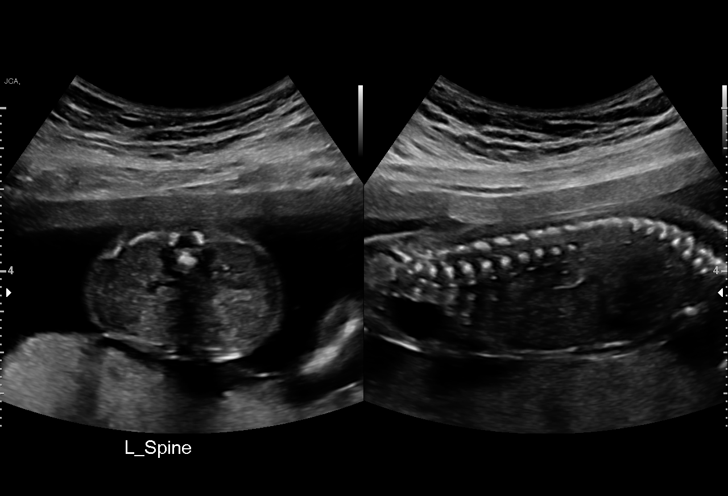
[im 70/73]
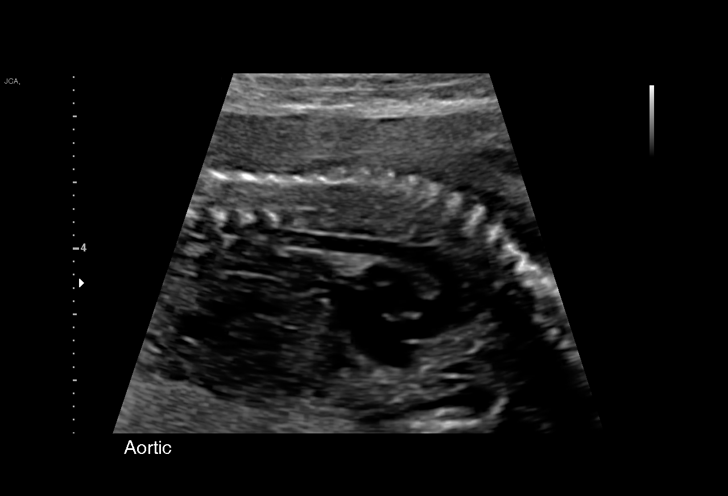

[13 of 28 positions shown; findings below may reference images not displayed]

1  US MFM OB COMP + 14 WK                76805.01    [place]

Indications

 Encounter for antenatal screening for
 malformations
 19 weeks gestation of pregnancy
 Low Risk NIPS
Fetal Evaluation

 Num Of Fetuses:         1
 Fetal Heart Rate(bpm):  150
 Cardiac Activity:       Observed
 Presentation:           Breech
 Placenta:               Posterior
 P. Cord Insertion:      Visualized

 Amniotic Fluid
 AFI FV:      Within normal limits

                             Largest Pocket(cm)

Biometry

 BPD:      44.4  mm     G. Age:  19w 3d         57  %    CI:        72.32   %    70 - 86
                                                         FL/HC:      18.9   %    16.1 -
 HC:      166.1  mm     G. Age:  19w 2d         43  %    HC/AC:      1.16        1.09 -
 AC:      143.5  mm     G. Age:  19w 5d         59  %    FL/BPD:     70.7   %
 FL:       31.4  mm     G. Age:  19w 5d         60  %    FL/AC:      21.9   %    20 - 24
 HUM:      31.9  mm     G. Age:  20w 5d         87  %
 CER:      22.2  mm     G. Age:  20w 6d         97  %
 NFT:       4.1  mm

 LV:        8.4  mm
 CM:        5.4  mm
 Est. FW:     306  gm    0 lb 11 oz      69  %
OB History

 Gravidity:    2          SAB:   1
 Living:       0
Gestational Age

 LMP:           19w 2d        Date:  04/16/20                 EDD:   01/21/21
 Clinical EDD:  19w 2d                                        EDD:   01/21/21
 U/S Today:     19w 4d                                        EDD:   01/19/21
 Best:          19w 2d     Det. By:  Clinical EDD             EDD:   01/21/21
Anatomy

 Cranium:               Appears normal         LVOT:                   Appears normal
 Cavum:                 Appears normal         Aortic Arch:            Appears normal
 Ventricles:            Appears normal         Ductal Arch:            Not well visualized
 Choroid Plexus:        Appears normal         Diaphragm:              Appears normal
 Cerebellum:            Appears normal         Stomach:                Appears normal, left
                                                                       sided
 Posterior Fossa:       Appears normal         Abdomen:                Appears normal
 Nuchal Fold:           Appears normal         Abdominal Wall:         Appears nml (cord
                                                                       insert, abd wall)
 Face:                  Orbits nl; profile not Cord Vessels:           Appears normal (3
                        well visualized                                vessel cord)
 Lips:                  Not well visualized    Kidneys:                Appear normal
 Palate:                Appears normal         Bladder:                Appears normal
 Thoracic:              Appears normal         Spine:                  Appears normal
 Heart:                 Appears normal         Upper Extremities:      Appears normal
                        (4CH, axis, and
                        situs)
 RVOT:                  Appears normal         Lower Extremities:      Appears normal

 Other:  Fetus appears to be female. Parents do not wish to know sex of fetus.
         Lenses visualized. Technically difficult due to fetal position.
Cervix Uterus Adnexa

 Cervix
 Length:            3.1  cm.
 Normal appearance by transabdominal scan.
Impression

 G2 P0. Patient is here for fetal anatomy scan.

 On cell-free fetal DNA screening, the risks of fetal
 aneuploidies are not increased .
 We performed fetal anatomy scan. No makers of
 aneuploidies or fetal structural defects are seen. Fetal
 biometry is consistent with her previously-established dates.
 Amniotic fluid is normal and good fetal activity is seen.
 Patient understands the limitations of ultrasound in detecting
 fetal anomalies.
 "MyChart":Patient does not want to know fetal gender. We
 informed her that fetal gender will be mentioned in ultrasound
 report and will be in "MyChart" that will be accessible to the
 patient .
Recommendations

 -An appointment was made for her to return in 4 weeks for
 completion of fetal anatomy.
                 [place], [place]

## 2022-08-12 IMAGING — US US MFM OB FOLLOW-UP
1 series · 13 of 28 positions shown · non-contrast
Comparison: none

[Series 1: us mfm ob follow-up · 13 of 110 slices shown]
[im 5/110]
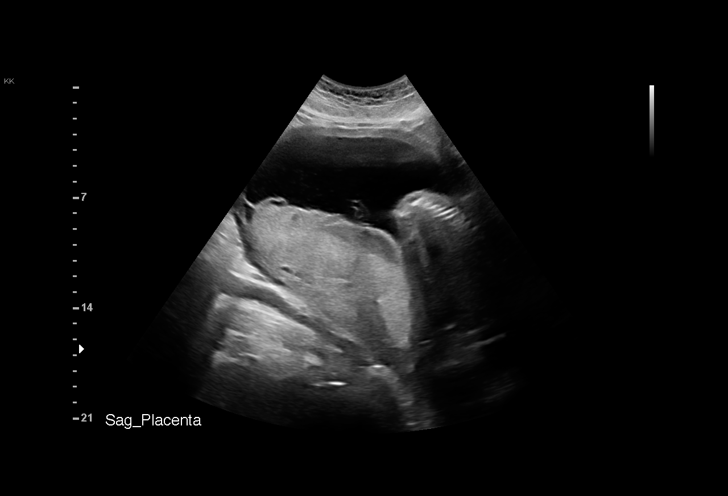
[im 13/110]
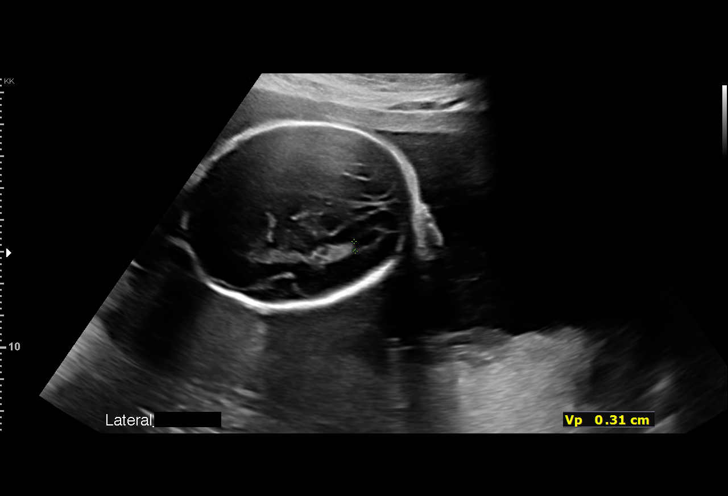
[im 21/110]
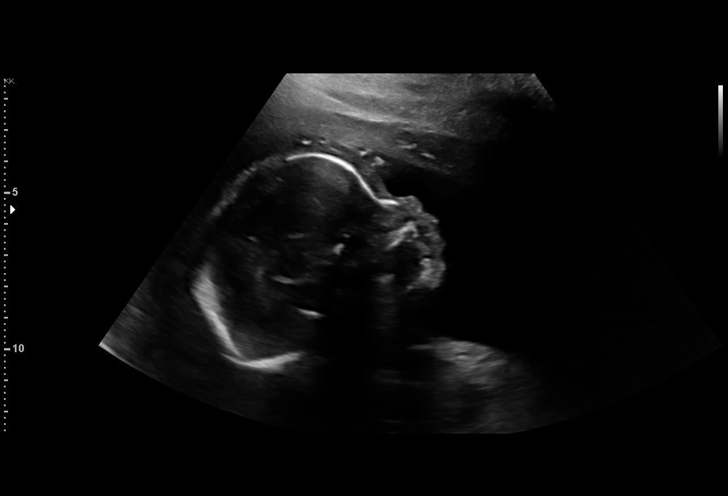
[im 29/110]
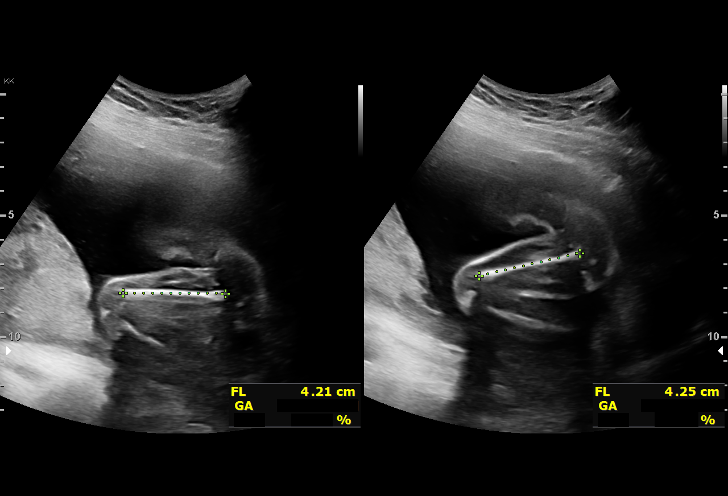
[im 37/110]
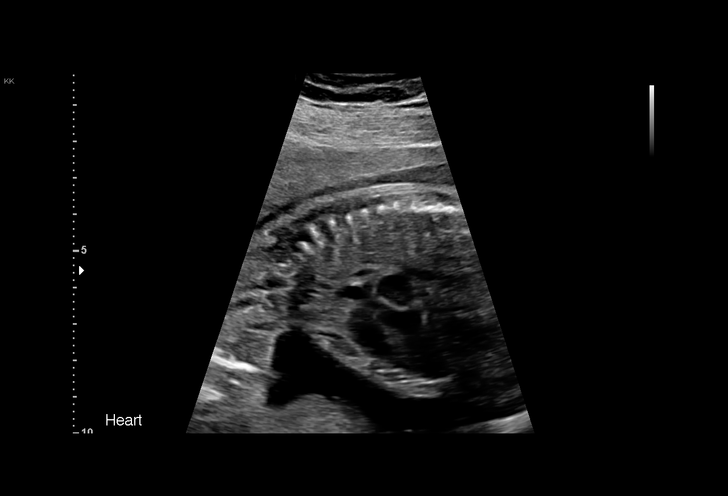
[im 45/110]
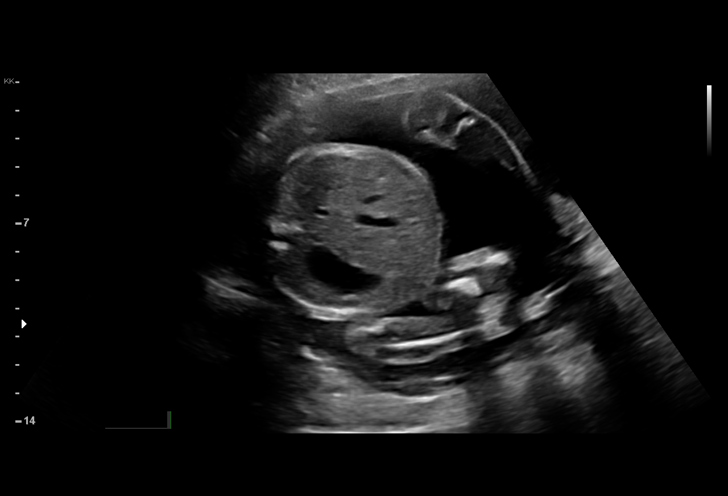
[im 57/110]
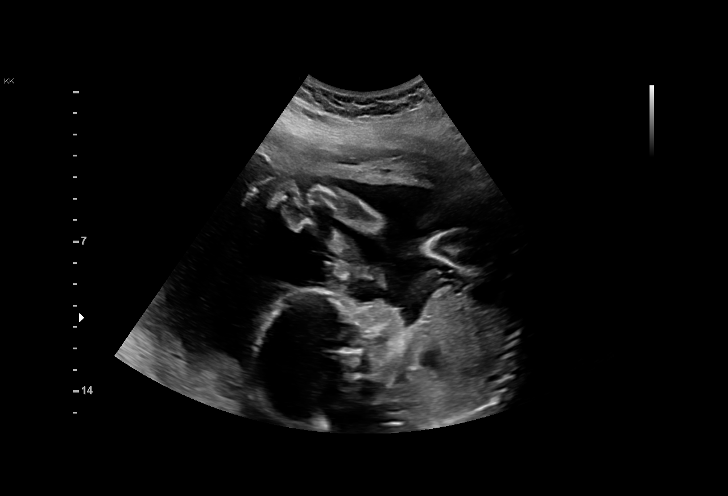
[im 65/110]
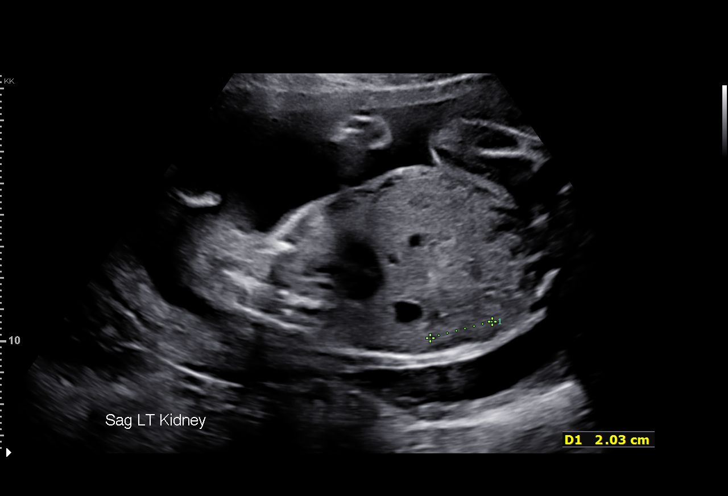
[im 73/110]
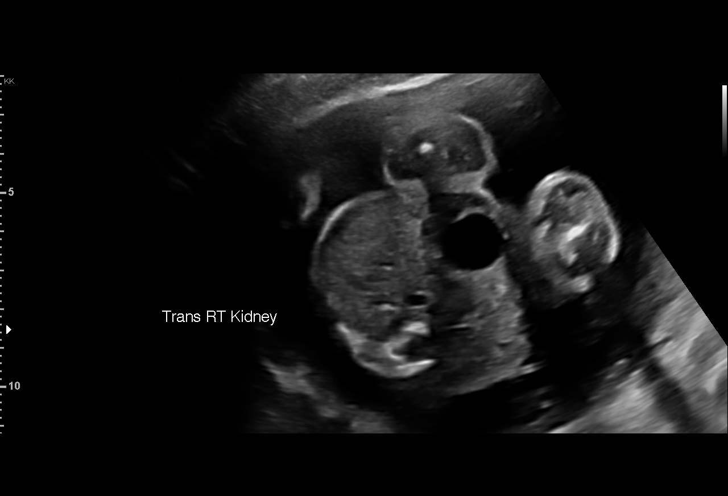
[im 81/110]
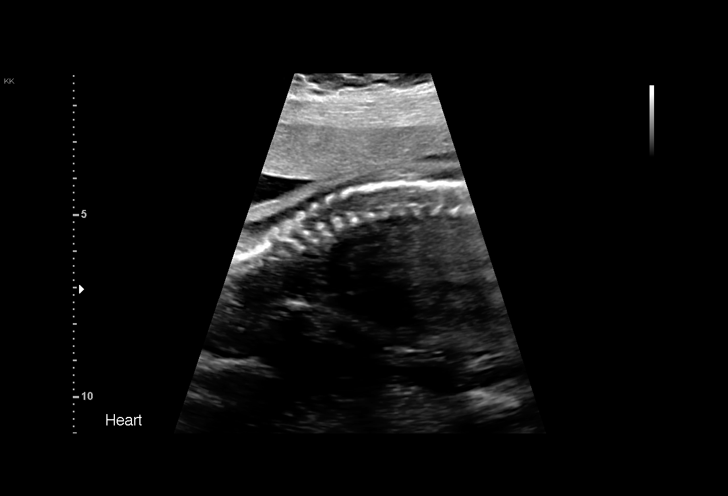
[im 89/110]
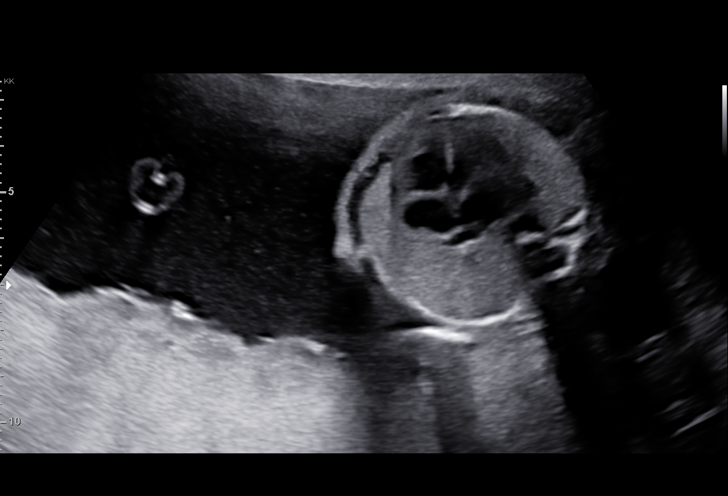
[im 97/110]
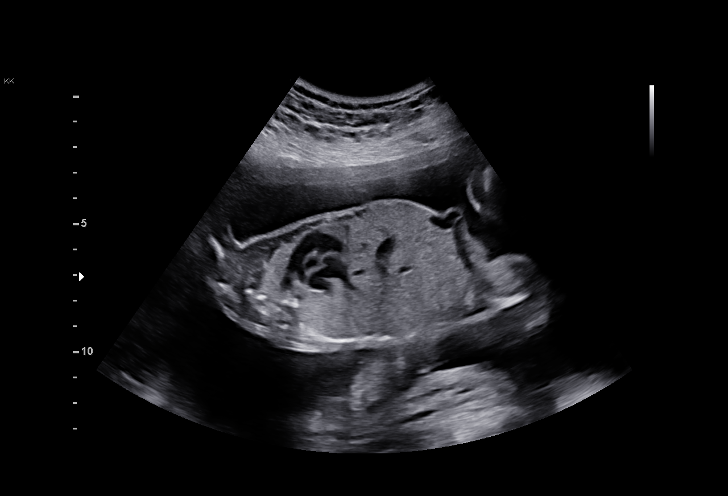
[im 105/110]
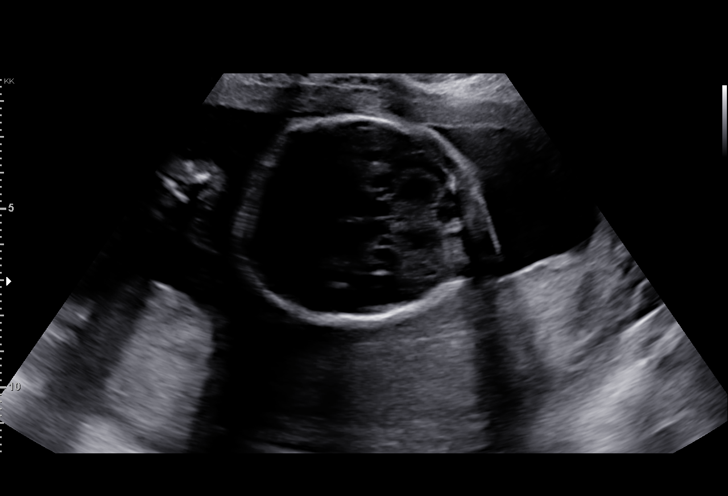

[13 of 28 positions shown; findings below may reference images not displayed]

Indications

 Obesity complicating pregnancy, second
 trimester (Pregravid BMI 32)
 Antenatal follow-up for nonvisualized fetal
 anatomy
 23 weeks gestation of pregnancy
 Low Risk NIPS
Vital Signs

 BMI:
Fetal Evaluation

 Num Of Fetuses:         1
 Fetal Heart Rate(bpm):  157
 Cardiac Activity:       Observed
 Presentation:           Breech
 Placenta:               Posterior
 P. Cord Insertion:      Previously Visualized

 Amniotic Fluid
 AFI FV:      Within normal limits

                             Largest Pocket(cm)

Biometry

 BPD:      57.7  mm     G. Age:  23w 5d         60  %    CI:        74.31   %    70 - 86
                                                         FL/HC:      20.0   %    19.2 -
 HC:      212.5  mm     G. Age:  23w 2d         35  %    HC/AC:      1.09        1.05 -
 AC:      194.5  mm     G. Age:  24w 1d         69  %    FL/BPD:     73.5   %    71 - 87
 FL:       42.4  mm     G. Age:  23w 6d         57  %    FL/AC:      21.8   %    20 - 24
 HUM:      40.5  mm     G. Age:  24w 4d         72  %
 CER:      24.9  mm     G. Age:  22w 5d         57  %

 LV:        3.1  mm

 Est. FW:     643  gm      1 lb 7 oz     74  %
OB History

 Gravidity:    2          SAB:   1
 Living:       0
Gestational Age

 LMP:           23w 2d        Date:  04/16/20                 EDD:   01/21/21
 Clinical EDD:  23w 2d                                        EDD:   01/21/21
 U/S Today:     23w 5d                                        EDD:   01/18/21
 Best:          23w 2d     Det. By:  Clinical EDD             EDD:   01/21/21
Anatomy

 Cranium:               Appears normal         LVOT:                   Previously seen
 Cavum:                 Previously seen        Aortic Arch:            Appears normal
 Ventricles:            Appears normal         Ductal Arch:            Appears normal
 Choroid Plexus:        Previously seen        Diaphragm:              Appears normal
 Cerebellum:            Appears normal         Stomach:                Appears normal, left
                                                                       sided
 Posterior Fossa:       Previously seen        Abdomen:                Appears normal
 Nuchal Fold:           Previously seen        Abdominal Wall:         Appears nml (cord
                                                                       insert, abd wall)
 Face:                  Orbits nl prev;        Cord Vessels:           Appears normal (3
                        Profile appears nl                             vessel cord)
 Lips:                  Appears normal         Kidneys:                Appear normal
 Palate:                Previously seen        Bladder:                Appears normal
 Thoracic:              Appears normal         Spine:                  Previously seen
 Heart:                 Appears normal         Upper Extremities:      Previously seen
                        (4CH, axis, and
                        situs)
 RVOT:                  Previously seen        Lower Extremities:      Previously seen

 Other:  Fetus appears to be female.  Technically difficult due to fetal position.
Cervix Uterus Adnexa

 Cervix
 Length:           3.01  cm.
 Normal appearance by transabdominal scan.
Comments

 This patient was seen for a follow up exam as the views of
 the fetal anatomy were unable to be fully visualized during
 her last exam.  She denies any problems since her last exam.
 She was informed that the fetal growth and amniotic fluid
 level appears appropriate for her gestational age.
 The views of the fetal anatomy were visualized today.  There
 were no obvious anomalies noted.
 The limitations of ultrasound in the detection of all anomalies
 was discussed.
 Follow-up as indicated.

## 2022-10-15 ENCOUNTER — Encounter (HOSPITAL_COMMUNITY): Payer: Self-pay

## 2022-10-15 ENCOUNTER — Other Ambulatory Visit: Payer: Self-pay

## 2022-10-15 ENCOUNTER — Emergency Department (HOSPITAL_COMMUNITY)
Admission: EM | Admit: 2022-10-15 | Discharge: 2022-10-15 | Disposition: A | Discharge status: Home / Self Care | Payer: Self-pay | Attending: Emergency Medicine | Admitting: Emergency Medicine

## 2022-10-15 ENCOUNTER — Emergency Department (HOSPITAL_COMMUNITY): Payer: Self-pay

## 2022-10-15 DIAGNOSIS — Z91010 Allergy to peanuts: Secondary | ICD-10-CM | POA: Insufficient documentation

## 2022-10-15 DIAGNOSIS — S8391XA Sprain of unspecified site of right knee, initial encounter: Secondary | ICD-10-CM | POA: Insufficient documentation

## 2022-10-15 DIAGNOSIS — W541XXA Struck by dog, initial encounter: Secondary | ICD-10-CM | POA: Insufficient documentation

## 2022-10-15 MED ORDER — IBUPROFEN 800 MG PO TABS: 800.0000 mg | ORAL_TABLET | Freq: Three times a day (TID) | ORAL | 0 refills | Status: AC | PRN

## 2022-10-15 NOTE — ED Notes (Signed)
Patient alert and oriented, no s/s of distress, ice pack given, jewelry removed.

## 2022-10-15 NOTE — Discharge Instructions (Addendum)
Schedule to see the Orthopaedist for recheck next week  Ice and elevate

## 2022-10-15 NOTE — ED Provider Notes (Signed)
[place] EMERGENCY DEPARTMENT AT [facility] Provider Note   CSN: [id number] Arrival date & time: 10/15/22  1258     History  No chief complaint on file.   Amanda Rhodes is a 32 y.o. female.  Pt complains of right knee pain.  Pt reports her dog hit her in the knee a week ago and she has had pain and difficulty straightening since.  Pt reports pain with walking   The history is provided by the patient. No language interpreter was used.  Knee Pain Location:  Knee Time since incident:  1 week Injury: no   Knee location:  R knee Pain details:    Quality:  Aching   Radiates to:  Does not radiate   Severity:  Moderate   Onset quality:  Gradual   Duration:  1 week   Timing:  Constant   Progression:  Worsening Chronicity:  New Dislocation: no   Relieved by:  Nothing Worsened by:  Nothing Ineffective treatments:  None tried Associated symptoms: swelling        Home Medications Prior to Admission medications   Medication Sig Start Date End Date Taking? Authorizing Provider  ibuprofen (ADVIL) 800 MG tablet Take 1 tablet (800 mg total) by mouth every 8 (eight) hours as needed. 10/15/22  Yes Cheron Schaumann K, PA-C  ferrous sulfate 325 (65 FE) MG EC tablet Take 1 tablet (325 mg total) by mouth every other day. Take with Vitamin C 12/24/20   Raelyn Mora, CNM  Prenatal Vit-Fe Fumarate-FA (MULTIVITAMIN-PRENATAL) 27-0.8 MG TABS tablet Take 1 tablet by mouth daily at 12 noon.    [provider]      Allergies    Peanut-containing drug products    Review of Systems   Review of Systems  Musculoskeletal:  Positive for arthralgias and joint swelling.  All other systems reviewed and are negative.   Physical Exam Updated Vital Signs BP (!) 140/82   Pulse 87   Temp 98.9 F (37.2 C) (Oral)   Resp 16   Ht 5\' 5"  (1.651 m)   Wt 95.3 kg   SpO2 100%   BMI 34.95 kg/m  Physical Exam Vitals and nursing note reviewed.  Constitutional:      Appearance: She  is well-developed.  HENT:     Head: Normocephalic.  Pulmonary:     Effort: Pulmonary effort is normal.  Abdominal:     General: There is no distension.  Musculoskeletal:        General: Swelling and tenderness present.     Cervical back: Normal range of motion.     Comments: Tender medial knee,  pain with range of motion, decreased extension   Neurological:     General: No focal deficit present.     Mental Status: She is alert and oriented to person, place, and time.  Psychiatric:        Mood and Affect: Mood normal.     ED Results / Procedures / Treatments   Labs (all labs ordered are listed, but only abnormal results are displayed) Labs Reviewed - No data to display  EKG None  Radiology DG Knee 1-2 Views Right  Result Date: 10/15/2022 CLINICAL DATA:  Injury. EXAM: RIGHT KNEE - 1-2 VIEW COMPARISON:  None Available. FINDINGS: No evidence of fracture, dislocation, or joint effusion. No evidence of arthropathy or other focal bone abnormality. Soft tissues are unremarkable. IMPRESSION: Negative. Electronically Signed   By: Lupita Raider M.D.   On: 10/15/2022 13:36  Procedures Procedures    Medications Ordered in ED Medications - No data to display  ED Course/ Medical Decision Making/ A&P                             Medical Decision Making Pt complains of knee pain after her dog hit her in the leg 1 week ago.   Amount and/or Complexity of Data Reviewed Radiology: ordered and independent interpretation performed. Decision-making details documented in ED Course.    Details: Xray right knee no fracture   Risk Prescription drug management. Risk Details: Pt placed in a knee immbolizer.  Pt advised to follow up with Orthopaedist for evaltuion            Final Clinical Impression(s) / ED Diagnoses Final diagnoses:  Sprain of right knee, unspecified ligament, initial encounter    Rx / DC Orders ED Discharge Orders          Ordered    ibuprofen (ADVIL)  800 MG tablet  Every 8 hours PRN        10/15/22 1409           An After Visit Summary was printed and given to the patient.    Elson Areas, [place] 10/15/22 1524    Alvira Monday, MD 10/15/22 2259

## 2022-10-15 NOTE — ED Triage Notes (Signed)
Pt came in via POV d/t Rt knee pain since her dog ran into her 1 week ago. Reports no pain when it is bent & 7/10 pain when she extends it or bends it.
